# Patient Record
Sex: Female | Born: 1980
Health system: Southern US, Community
[De-identification: ages and names within clinical notes are randomized; demographics above are authoritative.]

## PROBLEM LIST (undated history)

## (undated) DIAGNOSIS — E785 Hyperlipidemia, unspecified: Secondary | ICD-10-CM

## (undated) DIAGNOSIS — I10 Essential (primary) hypertension: Secondary | ICD-10-CM

## (undated) HISTORY — DX: Essential (primary) hypertension: I10

## (undated) HISTORY — DX: Hyperlipidemia, unspecified: E78.5

---

## 2005-05-23 ENCOUNTER — Other Ambulatory Visit: Admission: RE | Admit: 2005-05-23 | Discharge: 2005-05-23 | Payer: Self-pay | Admitting: Family Medicine

## 2006-05-10 ENCOUNTER — Other Ambulatory Visit: Admission: RE | Admit: 2006-05-10 | Discharge: 2006-05-10 | Payer: Self-pay | Admitting: Family Medicine

## 2008-06-26 ENCOUNTER — Other Ambulatory Visit: Admission: RE | Admit: 2008-06-26 | Discharge: 2008-06-26 | Payer: Self-pay | Admitting: Family Medicine

## 2008-08-14 ENCOUNTER — Ambulatory Visit: Payer: Self-pay | Admitting: Oncology

## 2008-09-24 ENCOUNTER — Ambulatory Visit: Payer: Self-pay | Admitting: Oncology

## 2008-09-24 LAB — COMPREHENSIVE METABOLIC PANEL
ALT: 30 U/L (ref 0–35)
AST: 28 U/L (ref 0–37)
Albumin: 3.9 g/dL (ref 3.5–5.2)
Alkaline Phosphatase: 81 U/L (ref 39–117)
Potassium: 3.7 mEq/L (ref 3.5–5.3)
Sodium: 137 mEq/L (ref 135–145)
Total Protein: 7.5 g/dL (ref 6.0–8.3)

## 2008-09-24 LAB — CBC & DIFF AND RETIC
Basophils Absolute: 0 10*3/uL (ref 0.0–0.1)
Eosinophils Absolute: 0.1 10*3/uL (ref 0.0–0.5)
HGB: 13.2 g/dL (ref 11.6–15.9)
LYMPH%: 20.1 % (ref 14.0–49.7)
MONO#: 0.7 10*3/uL (ref 0.1–0.9)
NEUT#: 6.8 10*3/uL — ABNORMAL HIGH (ref 1.5–6.5)
Platelets: 237 10*3/uL (ref 145–400)
RBC: 5.15 10*6/uL (ref 3.70–5.45)
RDW: 14.4 % (ref 11.2–14.5)
Retic %: 1.53 % — ABNORMAL HIGH (ref 0.50–1.50)
Retic Ct Abs: 78.8 10*3/uL — ABNORMAL HIGH (ref 18.30–72.70)
WBC: 9.5 10*3/uL (ref 3.9–10.3)

## 2008-09-28 LAB — HEMOGLOBINOPATHY EVALUATION
Hemoglobin Other: 0 % (ref 0.0–0.0)
Hgb A: 97.7 % (ref 96.8–97.8)
Hgb F Quant: 0 % (ref 0.0–2.0)
Hgb S Quant: 0 % (ref 0.0–0.0)

## 2009-07-30 ENCOUNTER — Other Ambulatory Visit: Admission: RE | Admit: 2009-07-30 | Discharge: 2009-07-30 | Payer: Self-pay | Admitting: Family Medicine

## 2012-06-27 ENCOUNTER — Other Ambulatory Visit: Payer: Self-pay | Admitting: Family Medicine

## 2012-06-27 ENCOUNTER — Other Ambulatory Visit (HOSPITAL_COMMUNITY)
Admission: RE | Admit: 2012-06-27 | Discharge: 2012-06-27 | Disposition: A | Discharge status: Home / Self Care | Payer: BC Managed Care – PPO | Source: Ambulatory Visit | Attending: Family Medicine | Admitting: Family Medicine

## 2012-06-27 DIAGNOSIS — Z1151 Encounter for screening for human papillomavirus (HPV): Secondary | ICD-10-CM | POA: Insufficient documentation

## 2012-06-27 DIAGNOSIS — Z124 Encounter for screening for malignant neoplasm of cervix: Secondary | ICD-10-CM | POA: Insufficient documentation

## 2012-10-26 ENCOUNTER — Other Ambulatory Visit: Payer: Self-pay | Admitting: Cardiology

## 2012-10-26 DIAGNOSIS — E7800 Pure hypercholesterolemia, unspecified: Secondary | ICD-10-CM

## 2012-10-26 DIAGNOSIS — Z79899 Other long term (current) drug therapy: Secondary | ICD-10-CM

## 2012-12-26 ENCOUNTER — Ambulatory Visit: Payer: Self-pay | Admitting: Cardiology

## 2013-02-04 ENCOUNTER — Other Ambulatory Visit (INDEPENDENT_AMBULATORY_CARE_PROVIDER_SITE_OTHER): Payer: BC Managed Care – PPO

## 2013-02-04 DIAGNOSIS — E7800 Pure hypercholesterolemia, unspecified: Secondary | ICD-10-CM

## 2013-02-04 DIAGNOSIS — Z79899 Other long term (current) drug therapy: Secondary | ICD-10-CM

## 2013-02-04 LAB — ALT: ALT: 32 U/L (ref 0–35)

## 2013-02-05 LAB — NMR LIPOPROFILE WITH LIPIDS
HDL Size: 9.2 nm (ref >=9.2)
HDL-C: 45 mg/dL (ref >=40)
LDL Size: 20.2 nm — ABNORMAL LOW (ref >20.5)
Large HDL-P: 4.2 umol/L — ABNORMAL LOW (ref >=4.8)
Small LDL Particle Number: 688 nmol/L — ABNORMAL HIGH (ref <=527)

## 2013-02-10 ENCOUNTER — Other Ambulatory Visit: Payer: Self-pay | Admitting: General Surgery

## 2013-02-10 ENCOUNTER — Encounter: Payer: Self-pay | Admitting: General Surgery

## 2013-02-10 DIAGNOSIS — E785 Hyperlipidemia, unspecified: Secondary | ICD-10-CM

## 2013-03-03 ENCOUNTER — Other Ambulatory Visit: Payer: Self-pay | Admitting: Family Medicine

## 2013-03-03 DIAGNOSIS — R109 Unspecified abdominal pain: Secondary | ICD-10-CM

## 2013-03-05 ENCOUNTER — Ambulatory Visit
Admission: RE | Admit: 2013-03-05 | Discharge: 2013-03-05 | Disposition: A | Discharge status: Home / Self Care | Payer: BC Managed Care – PPO | Source: Ambulatory Visit | Attending: Family Medicine | Admitting: Family Medicine

## 2013-03-05 DIAGNOSIS — R109 Unspecified abdominal pain: Secondary | ICD-10-CM

## 2013-07-07 ENCOUNTER — Encounter: Payer: Self-pay | Admitting: Cardiology

## 2013-08-11 ENCOUNTER — Other Ambulatory Visit (INDEPENDENT_AMBULATORY_CARE_PROVIDER_SITE_OTHER): Payer: BC Managed Care – PPO

## 2013-08-11 DIAGNOSIS — E785 Hyperlipidemia, unspecified: Secondary | ICD-10-CM

## 2013-08-11 LAB — ALT: ALT: 27 U/L (ref 0–35)

## 2013-08-13 LAB — NMR LIPOPROFILE WITH LIPIDS
Cholesterol, Total: 107 mg/dL (ref <200)
HDL Particle Number: 33.5 umol/L (ref >=30.5)
HDL Size: 8.5 nm (ref >=9.2)
HDL-C: 43 mg/dL (ref >=40)
LDL CALC: 50 mg/dL (ref <100)
LDL PARTICLE NUMBER: 936 nmol/L (ref <1000)
LDL Size: 20.4 nm (ref >20.5)
LP-IR SCORE: 58 (ref <=45)
Large HDL-P: 2 umol/L (ref >=4.8)
Large VLDL-P: 1.1 nmol/L (ref <=2.7)
Small LDL Particle Number: 601 nmol/L (ref <=527)
TRIGLYCERIDES: 70 mg/dL (ref <150)
VLDL SIZE: 48.1 nm (ref <=46.6)

## 2013-08-21 ENCOUNTER — Other Ambulatory Visit: Payer: Self-pay | Admitting: Cardiology

## 2013-08-25 ENCOUNTER — Encounter: Payer: Self-pay | Admitting: General Surgery

## 2013-08-25 ENCOUNTER — Other Ambulatory Visit: Payer: Self-pay | Admitting: General Surgery

## 2013-08-25 DIAGNOSIS — E7800 Pure hypercholesterolemia, unspecified: Secondary | ICD-10-CM

## 2014-02-03 ENCOUNTER — Telehealth: Payer: Self-pay | Admitting: *Deleted

## 2014-02-03 ENCOUNTER — Encounter: Payer: Self-pay | Admitting: Cardiology

## 2014-02-03 ENCOUNTER — Ambulatory Visit (INDEPENDENT_AMBULATORY_CARE_PROVIDER_SITE_OTHER): Payer: BC Managed Care – PPO | Admitting: Cardiology

## 2014-02-03 VITALS — BP 134/94 | HR 90 | Ht 67.0 in | Wt 326.0 lb

## 2014-02-03 DIAGNOSIS — K219 Gastro-esophageal reflux disease without esophagitis: Secondary | ICD-10-CM

## 2014-02-03 DIAGNOSIS — R0789 Other chest pain: Secondary | ICD-10-CM

## 2014-02-03 DIAGNOSIS — I10 Essential (primary) hypertension: Secondary | ICD-10-CM

## 2014-02-03 DIAGNOSIS — E785 Hyperlipidemia, unspecified: Secondary | ICD-10-CM

## 2014-02-03 MED ORDER — PANTOPRAZOLE SODIUM 40 MG PO TBEC: 40.0000 mg | DELAYED_RELEASE_TABLET | Freq: Every day | ORAL | Status: DC

## 2014-02-03 NOTE — Progress Notes (Signed)
[address] [place], [place]  [address] Phone: [phone] Fax:  [phone]  Date:  02/03/2014   ID:  Sarah LaundryLaura Ricco, DOB 1980-08-15, MRN [id number]  PCP:  No primary care provider on file.  Cardiologist:  Armanda Magicraci Turner, MD    History of Present Illness: Sarah LaundryLaura Marques is a 33 y.o. female with a history of HTN and dyslipidemia who presents today for followup.  She is doing well.  She denies any  SOB, DOE,palpitaitons,  dizziness or syncope.  She has been having chest pressure at any time of day or night and she had this when I saw her 2 years ago and stress test was fine.  She describes it as a tightness.  She has not taken anything for GERD but occasionally takes TUMS which sometimes helps.  She has not been having any belching but her symptoms are worse in the evening.  It is nonexertional.  She has been upset and is teary eyed today after her grandmother died recently.  She occasionally has some LE edema.     Wt Readings from Last 3 Encounters:  02/03/14 326 lb (147.873 kg)     Past Medical History  Diagnosis Date  . Hypertension   . Hyperlipidemia     Current Outpatient Prescriptions  Medication Sig Dispense Refill  . aspirin 81 MG tablet Take 81 mg by mouth daily.    . fexofenadine (ALLEGRA) 30 MG tablet Take 30 mg by mouth daily.    Marland Kitchen. lisinopril (PRINIVIL,ZESTRIL) 40 MG tablet Take 40 mg by mouth daily.  1  . simvastatin (ZOCOR) 40 MG tablet Take 40 mg by mouth daily.  1   No current facility-administered medications for this visit.    Allergies:    Allergies  Allergen Reactions  . Minocycline     PSEUDOTUMOR. WAS TAKING FOR ACNE     Social History:  The patient  reports that she has never smoked. She does not have any smokeless tobacco history on file. She reports that she drinks alcohol. She reports that she does not use illicit drugs.   Family History:  The patient's family history includes Cancer - Other in her maternal grandmother; Diabetes type II in  her mother; Heart attack in her maternal grandfather; Hyperlipidemia in her father, paternal grandfather, and paternal grandmother; Hypertension in her father, maternal grandfather, mother, paternal grandfather, and sister; Multiple sclerosis in her paternal grandmother; Pneumonia in her maternal grandfather and paternal grandmother.   ROS:  Please see the history of present illness.      All other systems reviewed and negative.   PHYSICAL EXAM: VS:  BP 134/94 mmHg  Pulse 90  Ht 5\' 7"  (1.702 m)  Wt 326 lb (147.873 kg)  BMI 51.05 kg/m2 Well nourished, well developed, in no acute distress HEENT: normal Neck: no JVD Cardiac:  normal S1, S2; RRR; no murmur Lungs:  clear to auscultation bilaterally, no wheezing, rhonchi or rales Abd: soft, nontender, no hepatomegaly Ext: no edema Skin: warm and dry Neuro:  CNs 2-12 intact, no focal abnormalities noted  EKG:  NSR with low voltage and no ST changes     ASSESSMENT AND PLAN:  1. HTN with elevated BP today but at home it has been running 120-135/80-2395mmHg but over the past 3 weeks it has been running low at 96/4876mmHg at her doctors office and then at home 97/1371mmHg.  I have asked her to continue on her Lisinopril and check her BP daily for a week and  call with results.   2. Dyslipidemia - I will get her last lipids from her PCP.  She will continue Simvastatin 3. Morbid obesity 4. Atypical CP that sounds more like GERD and had a stress test that was normal for similar symptoms.  I will try Protonix 40mg  daily.  Followup with PA in 2 weeks.  If symptoms dont improve then we will repeat an ETT.   Followup with me in 6 months  Signed, Armanda Magicraci Turner, MD [facility] 02/03/2014 3:22 PM

## 2014-02-03 NOTE — Telephone Encounter (Signed)
requested records, add on and chart prep was not advised of this until pt arrived.Marland Kitchen..Marland Kitchen

## 2014-02-03 NOTE — Patient Instructions (Addendum)
Your physician has recommended you make the following change in your medication:  1) START Protonix 40 mg daily  Your physician recommends that you schedule a follow-up appointment in: 2 weeks with an APP  Please check your blood pressure daily for one week and call us with results.  Your physician wants you to follow-up in: 6 months with Dr. Mayford Knifeurner. You will receive a reminder letter in the mail two months in advance. If you don't receive a letter, please call our office to schedule the follow-up appointment.

## 2014-02-04 NOTE — Telephone Encounter (Signed)
Found records in folder on turners cart, did receive records upon request and will save for next appt..Marland Kitchen

## 2014-02-17 ENCOUNTER — Telehealth: Payer: Self-pay | Admitting: Cardiology

## 2014-02-17 NOTE — Telephone Encounter (Signed)
New Msg        Susie from [facility] calling states they do not have any recent lipids on the pt, the last was 2008.   May be reached at [phone].

## 2014-02-24 ENCOUNTER — Ambulatory Visit (INDEPENDENT_AMBULATORY_CARE_PROVIDER_SITE_OTHER): Payer: BLUE CROSS/BLUE SHIELD | Admitting: Physician Assistant

## 2014-02-24 ENCOUNTER — Encounter: Payer: Self-pay | Admitting: Physician Assistant

## 2014-02-24 ENCOUNTER — Other Ambulatory Visit (INDEPENDENT_AMBULATORY_CARE_PROVIDER_SITE_OTHER): Payer: BLUE CROSS/BLUE SHIELD | Admitting: *Deleted

## 2014-02-24 VITALS — BP 132/86 | HR 108 | Ht 67.0 in | Wt 324.0 lb

## 2014-02-24 DIAGNOSIS — E7800 Pure hypercholesterolemia, unspecified: Secondary | ICD-10-CM

## 2014-02-24 DIAGNOSIS — R0789 Other chest pain: Secondary | ICD-10-CM

## 2014-02-24 DIAGNOSIS — I10 Essential (primary) hypertension: Secondary | ICD-10-CM

## 2014-02-24 DIAGNOSIS — E785 Hyperlipidemia, unspecified: Secondary | ICD-10-CM

## 2014-02-24 LAB — HEPATIC FUNCTION PANEL
ALK PHOS: 132 U/L — AB (ref 39–117)
ALT: 65 U/L — ABNORMAL HIGH (ref 0–35)
AST: 37 U/L (ref 0–37)
Albumin: 3.9 g/dL (ref 3.5–5.2)
Bilirubin, Direct: 0.2 mg/dL (ref 0.0–0.3)
Total Bilirubin: 0.5 mg/dL (ref 0.2–1.2)
Total Protein: 7.4 g/dL (ref 6.0–8.3)

## 2014-02-24 NOTE — Addendum Note (Signed)
Addended by: Tonita PhoenixBOWDEN, ROBIN K on: 02/24/2014 08:18 AM   Modules accepted: Orders

## 2014-02-24 NOTE — Assessment & Plan Note (Signed)
Patient had lipids last June. She will fax these to us. Repeat lipid panel in June 2016.

## 2014-02-24 NOTE — Assessment & Plan Note (Signed)
Exercise and weight loss program such as Weight Watchers recommended to the patient.

## 2014-02-24 NOTE — Progress Notes (Signed)
HPI: This is a 34 year old female patient of Dr. Carolanne Grumbling who she saw on 02/03/14 with atypical chest pain that sound more like GERD. She has stress test in 2013 for similar symptoms that was normal. She prescribed Protonix 40 mg once daily and had her follow-up with me today. She also has hypertension and her blood pressure was elevated at that office visit but she was very teary-eyed because her grandmother just died. Her blood pressures typically run on the low side so no changes were made. She also has HLD and morbid obesity. She has not had a lipid panel drawn in many years. 2-D echo in 2008 was normal.  Patient comes in today feeling much better. She's had no further chest pain since starting the Protonix. She said she had a lipid panel last June. She will fax it to [place]. She is starting an exercise program at her husband walking [place] their [place] every day and talking about buying a treadmill. Her blood pressures are running about 130/85 at home.  Allergies  Allergen Reactions  . Minocycline     PSEUDOTUMOR. WAS TAKING FOR ACNE      Current Outpatient Prescriptions  Medication Sig Dispense Refill  . aspirin 81 MG tablet Take 81 mg by mouth daily.    . fexofenadine (ALLEGRA) 30 MG tablet Take 30 mg by mouth daily.    Marland Kitchen lisinopril (PRINIVIL,ZESTRIL) 40 MG tablet Take 40 mg by mouth daily.  1  . pantoprazole (PROTONIX) 40 MG tablet Take 1 tablet (40 mg total) by mouth daily. 30 tablet 3  . simvastatin (ZOCOR) 40 MG tablet Take 40 mg by mouth daily.  1   No current facility-administered medications for this visit.    Past Medical History  Diagnosis Date  . Hypertension   . Hyperlipidemia     No past surgical history on file.  Family History  Problem Relation Age of Onset  . Hypertension Mother   . Diabetes type II Mother   . Hypertension Father   . Hyperlipidemia Father   . Hypertension Sister   . Cancer - Other Maternal Grandmother   . Hypertension Maternal  Grandfather   . Heart attack Maternal Grandfather   . Pneumonia Maternal Grandfather   . Hyperlipidemia Paternal Grandmother   . Multiple sclerosis Paternal Grandmother   . Pneumonia Paternal Grandmother   . Hypertension Paternal Grandfather   . Hyperlipidemia Paternal Grandfather     History   Social History  . Marital Status: Married    Spouse Name: N/A    Number of Children: N/A  . Years of Education: N/A   Occupational History  . Not on file.   Social History Main Topics  . Smoking status: Never Smoker   . Smokeless tobacco: Not on file  . Alcohol Use: Yes     Comment: OCCASIONALLY   . Drug Use: No  . Sexual Activity: Not on file   Other Topics Concern  . Not on file   Social History Narrative  . No narrative on file    ROS: See history of present illness otherwise negative  BP 132/86 mmHg  Pulse 108  Ht  (1.702 m)  Wt 324 lb (146.965 kg)  BMI 50.73 kg/m2  PHYSICAL EXAM: Well-nournished, in no acute distress. Neck: No JVD, HJR, Bruit, or thyroid enlargement  Lungs: No tachypnea, clear without wheezing, rales, or rhonchi  Cardiovascular: RRR, PMI not displaced, Normal S1 and S2, no murmurs, gallops, bruit, thrill,  or heave.  Abdomen: BS normal. Soft without organomegaly, masses, lesions or tenderness.  Extremities: without cyanosis, clubbing or edema. Good distal pulses bilateral  SKin: Warm, no lesions or rashes   Musculoskeletal: No deformities  Neuro: no focal signs   Wt Readings from Last 3 Encounters:  02/03/14 326 lb (147.873 kg)    Lab Results  Component Value Date   WBC 9.5 09/24/2008   HGB 13.2 09/24/2008   HCT 39.1 09/24/2008   PLT 237 09/24/2008   GLUCOSE 98 09/24/2008   TRIG 70 08/21/2013   LDLCALC 50 08/21/2013   ALT 27 08/11/2013   AST 28 09/24/2008   NA 137 09/24/2008   K 3.7 09/24/2008   CL 106 09/24/2008   CREATININE 0.85 09/24/2008   BUN 15 09/24/2008   CO2 27 09/24/2008    EKG: Not performed

## 2014-02-24 NOTE — Assessment & Plan Note (Signed)
Chest pain has resolved with addition of Protonix. Normal stress test in 2013. Follow-up with Dr. Mayford Knifeurner in 6 months.

## 2014-02-24 NOTE — Patient Instructions (Signed)
Your physician recommends that you continue on your current medications as directed. Please refer to the Current Medication list given to you today.  Your physician encouraged you to lose weight for better health.  Your physician wants you to follow-up in: 6 months with Dr.Turner You will receive a reminder letter in the mail two months in advance. If you don't receive a letter, please call our office to schedule the follow-up appointment.     Low-Sodium Eating Plan Sodium raises blood pressure and causes water to be held in the body. Getting less sodium from food will help lower your blood pressure, reduce any swelling, and protect your heart, liver, and kidneys. We get sodium by adding salt (sodium chloride) to food. Most of our sodium comes from canned, boxed, and frozen foods. Restaurant foods, fast foods, and pizza are also very high in sodium. Even if you take medicine to lower your blood pressure or to reduce fluid in your body, getting less sodium from your food is important. WHAT IS MY PLAN? Most people should limit their sodium intake to 2,300 mg a day. Your health care provider recommends that you limit your sodium intake to __________ a day.  WHAT DO I NEED TO KNOW ABOUT THIS EATING PLAN? For the low-sodium eating plan, you will follow these general guidelines:  Choose foods with a % Daily Value for sodium of less than 5% (as listed on the food label).   Use salt-free seasonings or herbs instead of table salt or sea salt.   Check with your health care provider or pharmacist before using salt substitutes.   Eat fresh foods.  Eat more vegetables and fruits.  Limit canned vegetables. If you do use them, rinse them well to decrease the sodium.   Limit cheese to 1 oz (28 g) per day.   Eat lower-sodium products, often labeled as "lower sodium" or "no salt added."  Avoid foods that contain monosodium glutamate (MSG). MSG is sometimes added to [place] food and some canned  foods.  Check food labels (Nutrition Facts labels) on foods to learn how much sodium is in one serving.  Eat more home-cooked food and less restaurant, buffet, and fast food.  When eating at a restaurant, ask that your food be prepared with less salt or none, if possible.  HOW DO I READ FOOD LABELS FOR SODIUM INFORMATION? The Nutrition Facts label lists the amount of sodium in one serving of the food. If you eat more than one serving, you must multiply the listed amount of sodium by the number of servings. Food labels may also identify foods as:  Sodium free--Less than 5 mg in a serving.  Very low sodium--35 mg or less in a serving.  Low sodium--140 mg or less in a serving.  Light in sodium--50% less sodium in a serving. For example, if a food that usually has 300 mg of sodium is changed to become light in sodium, it will have 150 mg of sodium.  Reduced sodium--25% less sodium in a serving. For example, if a food that usually has 400 mg of sodium is changed to reduced sodium, it will have 300 mg of sodium. WHAT FOODS CAN I EAT? Grains Low-sodium cereals, including oats, puffed wheat and rice, and shredded wheat cereals. Low-sodium crackers. Unsalted rice and pasta. Lower-sodium bread.  Vegetables Frozen or fresh vegetables. Low-sodium or reduced-sodium canned vegetables. Low-sodium or reduced-sodium tomato sauce and paste. Low-sodium or reduced-sodium tomato and vegetable juices.  Fruits Fresh, frozen, and canned fruit.  Fruit juice.  Meat and Other Protein Products Low-sodium canned tuna and salmon. Fresh or frozen meat, poultry, seafood, and fish. Lamb. Unsalted nuts. Dried beans, peas, and lentils without added salt. Unsalted canned beans. Homemade soups without salt. Eggs.  Dairy Milk. Soy milk. Ricotta cheese. Low-sodium or reduced-sodium cheeses. Yogurt.  Condiments Fresh and dried herbs and spices. Salt-free seasonings. Onion and garlic powders. Low-sodium varieties of  mustard and ketchup. Lemon juice.  Fats and Oils Reduced-sodium salad dressings. Unsalted butter.  Other Unsalted popcorn and pretzels.  The items listed above may not be a complete list of recommended foods or beverages. Contact your dietitian for more options. WHAT FOODS ARE NOT RECOMMENDED? Grains Instant hot cereals. Bread stuffing, pancake, and biscuit mixes. Croutons. Seasoned rice or pasta mixes. Noodle soup cups. Boxed or frozen macaroni and cheese. Self-rising flour. Regular salted crackers. Vegetables Regular canned vegetables. Regular canned tomato sauce and paste. Regular tomato and vegetable juices. Frozen vegetables in sauces. Salted french fries. Olives. Rosita Fire. Relishes. Sauerkraut. Salsa. Meat and Other Protein Products Salted, canned, smoked, spiced, or pickled meats, seafood, or fish. Bacon, ham, sausage, hot dogs, corned beef, chipped beef, and packaged luncheon meats. Salt pork. Jerky. Pickled herring. Anchovies, regular canned tuna, and sardines. Salted nuts. Dairy Processed cheese and cheese spreads. Cheese curds. Blue cheese and cottage cheese. Buttermilk.  Condiments Onion and garlic salt, seasoned salt, table salt, and sea salt. Canned and packaged gravies. Worcestershire sauce. Tartar sauce. Barbecue sauce. Teriyaki sauce. Soy sauce, including reduced sodium. Steak sauce. Fish sauce. Oyster sauce. Cocktail sauce. Horseradish. Regular ketchup and mustard. Meat flavorings and tenderizers. Bouillon cubes. Hot sauce. Tabasco sauce. Marinades. Taco seasonings. Relishes. Fats and Oils Regular salad dressings. Salted butter. Margarine. Ghee. Bacon fat.  Other Potato and tortilla chips. Corn chips and puffs. Salted popcorn and pretzels. Canned or dried soups. Pizza. Frozen entrees and pot pies.  The items listed above may not be a complete list of foods and beverages to avoid. Contact your dietitian for more information. Document Released: 07/15/2001 Document  Revised: 01/28/2013 Document Reviewed: 11/27/2012 [facility] Patient Information 2015 [place], [place]. This information is not intended to replace advice given to you by your health care provider. Make sure you discuss any questions you have with your health care provider.

## 2014-02-24 NOTE — Assessment & Plan Note (Signed)
Blood pressure is stable. Recommend 2 g sodium diet.

## 2014-02-25 ENCOUNTER — Other Ambulatory Visit: Payer: Self-pay

## 2014-02-26 LAB — NMR LIPOPROFILE WITH LIPIDS
Cholesterol, Total: 126 mg/dL (ref 100–199)
HDL Particle Number: 32.3 umol/L (ref >=30.5)
HDL Size: 9.1 nm — ABNORMAL LOW (ref >=9.2)
HDL-C: 46 mg/dL (ref >39)
LARGE HDL: 4.2 umol/L — AB (ref >=4.8)
LDL CALC: 64 mg/dL (ref 0–99)
LDL Particle Number: 824 nmol/L (ref <1000)
LDL SIZE: 20.6 nm (ref >=20.8)
LP-IR Score: 53 — ABNORMAL HIGH (ref <=45)
Large VLDL-P: 3.2 nmol/L — ABNORMAL HIGH (ref <=2.7)
SMALL LDL PARTICLE NUMBER: 494 nmol/L (ref <=527)
TRIGLYCERIDES: 81 mg/dL (ref 0–149)
VLDL Size: 47.2 nm — ABNORMAL HIGH (ref <=46.6)

## 2014-05-28 ENCOUNTER — Encounter: Payer: Self-pay | Admitting: Cardiology

## 2014-07-03 ENCOUNTER — Other Ambulatory Visit: Payer: Self-pay | Admitting: Cardiology

## 2014-09-02 ENCOUNTER — Other Ambulatory Visit: Payer: Self-pay | Admitting: Cardiology

## 2014-09-02 ENCOUNTER — Other Ambulatory Visit: Payer: Self-pay

## 2014-09-02 MED ORDER — PANTOPRAZOLE SODIUM 40 MG PO TBEC: 40.0000 mg | DELAYED_RELEASE_TABLET | Freq: Every day | ORAL | Status: DC

## 2014-09-29 NOTE — Progress Notes (Signed)
Cardiology Office Note   Date:  09/30/2014   ID:  Sarah Frey, DOB [birth year], MRN [id number]  PCP:  Emeterio Reeve, MD    Chief Complaint  Patient presents with  . Atypical chest pain      History of Present Illness: This is a 34 year old female  who has a history of atypical chest pain that sounded more like GERD. She had a stress test in 2013 for similar symptoms that was normal. She was started onProtonix 40 mg once daily and was seen back by the PA and was feeling much better. She's had no further chest pain since starting the Protonix but still occasionally has some heartburn that resolves with TUMS. She has had some LE edema usually at work when sitting for prolonged periods of time.  She has been walking some for exercise. She denies any SOB, dizziness, palptiations or syncope.      Past Medical History  Diagnosis Date  . Hypertension   . Hyperlipidemia     History reviewed. No pertinent past surgical history.   Current Outpatient Prescriptions  Medication Sig Dispense Refill  . aspirin 81 MG tablet Take 81 mg by mouth daily.    . fexofenadine (ALLEGRA) 30 MG tablet Take 30 mg by mouth daily.    Marland Kitchen lisinopril (PRINIVIL,ZESTRIL) 40 MG tablet Take 40 mg by mouth daily.    . pantoprazole (PROTONIX) 40 MG tablet Take 1 tablet (40 mg total) by mouth daily. 30 tablet 1  . simvastatin (ZOCOR) 40 MG tablet Take 40 mg by mouth daily.     No current facility-administered medications for this visit.    Allergies:   Minocycline    Social History:  The patient  reports that she has never smoked. She does not have any smokeless tobacco history on file. She reports that she drinks alcohol. She reports that she does not use illicit drugs.   Family History:  The patient's family history includes Cancer - Other in her maternal grandmother; Diabetes type II in her mother; Heart attack in her maternal grandfather; Hyperlipidemia in her father, paternal  grandfather, and paternal grandmother; Hypertension in her father, maternal grandfather, mother, paternal grandfather, and sister; Multiple sclerosis in her paternal grandmother; Pneumonia in her maternal grandfather and paternal grandmother.    ROS:  Please see the history of present illness.   Otherwise, review of systems are positive for none.   All other systems are reviewed and negative.    PHYSICAL EXAM: VS:  BP 120/82 mmHg  Pulse 87  Ht  (1.702 m)  Wt 321 lb 12.8 oz (145.968 kg)  BMI 50.39 kg/m2  SpO2 98% , BMI Body mass index is 50.39 kg/(m^2). GEN: Well nourished, well developed, in no acute distress HEENT: normal Neck: no JVD, carotid bruits, or masses Cardiac: RRR; no murmurs, rubs, or gallops,no edema  Respiratory:  clear to auscultation bilaterally, normal work of breathing GI: soft, nontender, nondistended, + BS MS: no deformity or atrophy Skin: warm and dry, no rash Neuro:  Strength and sensation are intact Psych: euthymic mood, full affect   EKG:  EKG is not ordered today.    Recent Labs: 02/24/2014: ALT 65*    Lipid Panel    Component Value Date/Time   CHOL 126 02/24/2014 0818   TRIG 81 02/24/2014 0818   HDL 46 02/24/2014 0818   LDLCALC 64 02/24/2014 0818  Wt Readings from Last 3 Encounters:  09/30/14 321 lb 12.8 oz (145.968 kg)  02/24/14 324 lb (146.965 kg)  02/03/14 326 lb (147.873 kg)    ASSESSMENT AND PLAN:  1. HTN controlled BP on ACE I. 2. Dyslipidemia - She will continue Simvastatin.  Check FLP and ALT 3. Morbid obesity 4.      Atypical CP that sounds more like GERD and had a stress test that was normal for similar symptoms.This has resolved on PPI     Current medicines are reviewed at length with the patient today.  The patient does not have concerns regarding medicines.  The following changes have been made:  no change  Labs/ tests ordered today: See above Assessment and Plan No orders of the defined types were  placed in this encounter.     Disposition:   FU with me in PRN Signed, Quintella Reichert, MD  09/30/2014 9:09 AM    [facility] [address] [place], [place], [place]  [address] Phone: [phone]; Fax: [phone]

## 2014-09-30 ENCOUNTER — Ambulatory Visit (INDEPENDENT_AMBULATORY_CARE_PROVIDER_SITE_OTHER): Payer: 59 | Admitting: Cardiology

## 2014-09-30 ENCOUNTER — Encounter: Payer: Self-pay | Admitting: Cardiology

## 2014-09-30 VITALS — BP 120/82 | HR 87 | Ht 67.0 in | Wt 321.8 lb

## 2014-09-30 DIAGNOSIS — I10 Essential (primary) hypertension: Secondary | ICD-10-CM

## 2014-09-30 DIAGNOSIS — E785 Hyperlipidemia, unspecified: Secondary | ICD-10-CM | POA: Diagnosis not present

## 2014-09-30 DIAGNOSIS — R0789 Other chest pain: Secondary | ICD-10-CM

## 2014-09-30 LAB — HEPATIC FUNCTION PANEL
ALT: 27 U/L (ref 0–35)
AST: 20 U/L (ref 0–37)
Albumin: 3.9 g/dL (ref 3.5–5.2)
Alkaline Phosphatase: 101 U/L (ref 39–117)
BILIRUBIN DIRECT: 0.1 mg/dL (ref 0.0–0.3)
Total Bilirubin: 0.4 mg/dL (ref 0.2–1.2)
Total Protein: 7.2 g/dL (ref 6.0–8.3)

## 2014-09-30 LAB — LIPID PANEL
Cholesterol: 109 mg/dL (ref 0–200)
HDL: 38.5 mg/dL — ABNORMAL LOW (ref >39.00)
LDL Cholesterol: 54 mg/dL (ref 0–99)
NonHDL: 70.85
Total CHOL/HDL Ratio: 3
Triglycerides: 86 mg/dL (ref 0.0–149.0)
VLDL: 17.2 mg/dL (ref 0.0–40.0)

## 2014-09-30 NOTE — Patient Instructions (Signed)
Medication Instructions:  Your physician recommends that you continue on your current medications as directed. Please refer to the Current Medication list given to you today.   Labwork: Today: LFTs, Lipids  Testing/Procedures: None  Follow-Up: Your physician wants you to follow-up in: 1 year with Dr. Mayford Knife. You will receive a reminder letter in the mail two months in advance. If you don't receive a letter, please call our office to schedule the follow-up appointment.   Any Other Special Instructions Will Be Listed Below (If Applicable).

## 2014-10-01 ENCOUNTER — Encounter: Payer: Self-pay | Admitting: Cardiology

## 2014-10-01 NOTE — Telephone Encounter (Signed)
New message ° ° °Patient returning call back to nurse.  °

## 2014-10-01 NOTE — Telephone Encounter (Signed)
This encounter was created in error - please disregard.

## 2014-11-09 ENCOUNTER — Other Ambulatory Visit: Payer: Self-pay | Admitting: Cardiology

## 2015-06-30 DIAGNOSIS — M25522 Pain in left elbow: Secondary | ICD-10-CM | POA: Diagnosis not present

## 2015-07-26 DIAGNOSIS — Z79899 Other long term (current) drug therapy: Secondary | ICD-10-CM | POA: Diagnosis not present

## 2015-07-26 DIAGNOSIS — I10 Essential (primary) hypertension: Secondary | ICD-10-CM | POA: Diagnosis not present

## 2015-07-26 DIAGNOSIS — D509 Iron deficiency anemia, unspecified: Secondary | ICD-10-CM | POA: Diagnosis not present

## 2015-07-26 DIAGNOSIS — E559 Vitamin D deficiency, unspecified: Secondary | ICD-10-CM | POA: Diagnosis not present

## 2015-07-26 DIAGNOSIS — E8881 Metabolic syndrome: Secondary | ICD-10-CM | POA: Diagnosis not present

## 2015-07-28 ENCOUNTER — Other Ambulatory Visit (HOSPITAL_COMMUNITY)
Admission: RE | Admit: 2015-07-28 | Discharge: 2015-07-28 | Disposition: A | Discharge status: Home / Self Care | Payer: BLUE CROSS/BLUE SHIELD | Source: Ambulatory Visit | Attending: Family Medicine | Admitting: Family Medicine

## 2015-07-28 ENCOUNTER — Other Ambulatory Visit: Payer: Self-pay | Admitting: Family Medicine

## 2015-07-28 DIAGNOSIS — Z0000 Encounter for general adult medical examination without abnormal findings: Secondary | ICD-10-CM | POA: Diagnosis not present

## 2015-07-28 DIAGNOSIS — Z01411 Encounter for gynecological examination (general) (routine) with abnormal findings: Secondary | ICD-10-CM | POA: Diagnosis not present

## 2015-07-28 DIAGNOSIS — Z23 Encounter for immunization: Secondary | ICD-10-CM | POA: Diagnosis not present

## 2015-07-29 LAB — CYTOLOGY - PAP

## 2015-08-26 ENCOUNTER — Encounter: Payer: Self-pay | Admitting: Cardiology

## 2015-10-08 ENCOUNTER — Ambulatory Visit: Payer: Self-pay | Admitting: Cardiology

## 2015-10-12 ENCOUNTER — Encounter: Payer: Self-pay | Admitting: Cardiology

## 2015-10-27 ENCOUNTER — Encounter: Payer: Self-pay | Admitting: Cardiology

## 2015-10-27 ENCOUNTER — Ambulatory Visit (INDEPENDENT_AMBULATORY_CARE_PROVIDER_SITE_OTHER): Payer: BLUE CROSS/BLUE SHIELD | Admitting: Cardiology

## 2015-10-27 ENCOUNTER — Encounter (INDEPENDENT_AMBULATORY_CARE_PROVIDER_SITE_OTHER): Payer: Self-pay

## 2015-10-27 VITALS — BP 132/70 | HR 96 | Ht 67.0 in | Wt 319.0 lb

## 2015-10-27 DIAGNOSIS — K219 Gastro-esophageal reflux disease without esophagitis: Secondary | ICD-10-CM | POA: Diagnosis not present

## 2015-10-27 DIAGNOSIS — I10 Essential (primary) hypertension: Secondary | ICD-10-CM | POA: Diagnosis not present

## 2015-10-27 DIAGNOSIS — R0789 Other chest pain: Secondary | ICD-10-CM | POA: Diagnosis not present

## 2015-10-27 NOTE — Progress Notes (Signed)
Cardiology Office Note    Date:  10/27/2015   ID:  Sarah Frey, DOB 05/31/1980, MRN [id number]  PCP:  Emeterio ReeveWOLTERS,SHARON A, MD  Cardiologist:  Armanda Magicraci Jakala Herford, MD   Chief Complaint  Patient presents with  . Chest Pain  . Hypertension    History of Present Illness:  Sarah Frey is a 35 y.o. female who has a  history of atypical chest pain felt related to GERD. She had a stress test in 2013 for similar symptoms that was normal and her symptoms resolved with PPI therapy and TUMS. She presents back today for followup.  She has not had any further episodes of CP.  She has had some LE edema usually at work when sitting for prolonged periods of time.  She denies any SOB, dizziness, palptiations or syncope.  She does not get any significant aerobic exercise.    Past Medical History:  Diagnosis Date  . Hyperlipidemia   . Hypertension     History reviewed. No pertinent surgical history.  Current Medications: Outpatient Medications Prior to Visit  Medication Sig Dispense Refill  . aspirin 81 MG tablet Take 81 mg by mouth daily.    . fexofenadine (ALLEGRA) 30 MG tablet Take 30 mg by mouth daily.    Marland Kitchen. lisinopril (PRINIVIL,ZESTRIL) 40 MG tablet Take 40 mg by mouth daily.    . pantoprazole (PROTONIX) 40 MG tablet TAKE ONE TABLET BY MOUTH ONCE DAILY 30 tablet 6  . simvastatin (ZOCOR) 40 MG tablet Take 40 mg by mouth daily.     No facility-administered medications prior to visit.      Allergies:   Minocycline   Social History   Social History  . Marital status: Married    Spouse name: N/A  . Number of children: N/A  . Years of education: N/A   Social History Main Topics  . Smoking status: Never Smoker  . Smokeless tobacco: Never Used  . Alcohol use Yes     Comment: OCCASIONALLY   . Drug use: No  . Sexual activity: Not Asked   Other Topics Concern  . None   Social History Narrative  . None     Family History:  The patient's family history includes Cancer - Other in her  maternal grandmother; Diabetes type II in her mother; Heart attack in her maternal grandfather; Hyperlipidemia in her father, paternal grandfather, and paternal grandmother; Hypertension in her father, maternal grandfather, mother, paternal grandfather, and sister; Multiple sclerosis in her paternal grandmother; Pneumonia in her maternal grandfather and paternal grandmother.   ROS:   Please see the history of present illness.    ROS All other systems reviewed and are negative.  No flowsheet data found.     PHYSICAL EXAM:   VS:  BP 132/70   Pulse 96   Ht 5\' 7"  (1.702 m)   Wt (!) 319 lb (144.7 kg)   BMI 49.96 kg/m    GEN: Well nourished, well developed, in no acute distress  HEENT: normal  Neck: no JVD, carotid bruits, or masses Cardiac: RRR; no murmurs, rubs, or gallops,no edema.  Intact distal pulses bilaterally.  Respiratory:  clear to auscultation bilaterally, normal work of breathing GI: soft, nontender, nondistended, + BS MS: no deformity or atrophy  Skin: warm and dry, no rash Neuro:  Alert and Oriented x 3, Strength and sensation are intact Psych: euthymic mood, full affect  Wt Readings from Last 3 Encounters:  10/27/15 (!) 319 lb (144.7 kg)  09/30/14 (!) 321 lb 12.8  oz (146 kg)  02/24/14 (!) 324 lb (147 kg)      Studies/Labs Reviewed:   EKG:  EKG is ordered today.  The ekg ordered today demonstrates NSR at 96bpm with no ST changes  Recent Labs: No results found for requested labs within last 8760 hours.   Lipid Panel    Component Value Date/Time   CHOL 109 09/30/2014 0932   CHOL 126 02/24/2014 0818   TRIG 86.0 09/30/2014 0932   TRIG 81 02/24/2014 0818   HDL 38.50 (L) 09/30/2014 0932   HDL 46 02/24/2014 0818   CHOLHDL 3 09/30/2014 0932   VLDL 17.2 09/30/2014 0932   LDLCALC 54 09/30/2014 0932   LDLCALC 64 02/24/2014 0818    Additional studies/ records that were reviewed today include:  none    ASSESSMENT:    1. Atypical chest pain   2. Benign  essential HTN   3. Gastroesophageal reflux disease without esophagitis      PLAN:  In order of problems listed above:  1. Atypical chest pain c/w GERD with no reoccurrence of CP. 2. HTN - BP controlled on current meds.  Continue ACE I.  3. GERD - continue PPI.    Medication Adjustments/Labs and Tests Ordered: Current medicines are reviewed at length with the patient today.  Concerns regarding medicines are outlined above.  Medication changes, Labs and Tests ordered today are listed in the Patient Instructions below.  Patient Instructions  Your physician recommends that you continue on your current medications as directed. Please refer to the Current Medication list given to you today.  Your physician wants you to follow-up in: YEAR WITH  DR  Sherlyn Lick will receive a reminder letter in the mail two months in advance. If you don't receive a letter, please call our office to schedule the follow-up appointment.     Signed, Armanda Magic, MD  10/27/2015 11:46 AM    [facility] [address] [place], [place], [place]  [address] Phone: [phone]; Fax: [phone]

## 2015-10-27 NOTE — Patient Instructions (Signed)
Your physician recommends that you continue on your current medications as directed. Please refer to the Current Medication list given to you today.   Your physician wants you to follow-up in: YEAR WITH DR TURNER You will receive a reminder letter in the mail two months in advance. If you don't receive a letter, please call our office to schedule the follow-up appointment.  

## 2015-11-29 DIAGNOSIS — E559 Vitamin D deficiency, unspecified: Secondary | ICD-10-CM | POA: Diagnosis not present

## 2016-01-25 DIAGNOSIS — J069 Acute upper respiratory infection, unspecified: Secondary | ICD-10-CM | POA: Diagnosis not present

## 2016-04-19 DIAGNOSIS — E119 Type 2 diabetes mellitus without complications: Secondary | ICD-10-CM | POA: Diagnosis not present

## 2016-09-25 DIAGNOSIS — E7800 Pure hypercholesterolemia, unspecified: Secondary | ICD-10-CM | POA: Diagnosis not present

## 2016-09-25 DIAGNOSIS — E559 Vitamin D deficiency, unspecified: Secondary | ICD-10-CM | POA: Diagnosis not present

## 2016-09-25 DIAGNOSIS — Z79899 Other long term (current) drug therapy: Secondary | ICD-10-CM | POA: Diagnosis not present

## 2016-09-25 DIAGNOSIS — E8881 Metabolic syndrome: Secondary | ICD-10-CM | POA: Diagnosis not present

## 2016-09-25 DIAGNOSIS — D509 Iron deficiency anemia, unspecified: Secondary | ICD-10-CM | POA: Diagnosis not present

## 2016-09-25 DIAGNOSIS — Z0000 Encounter for general adult medical examination without abnormal findings: Secondary | ICD-10-CM | POA: Diagnosis not present

## 2016-11-08 ENCOUNTER — Other Ambulatory Visit: Payer: Self-pay | Admitting: Cardiology

## 2016-11-08 DIAGNOSIS — F4322 Adjustment disorder with anxiety: Secondary | ICD-10-CM | POA: Diagnosis not present

## 2016-11-09 NOTE — Telephone Encounter (Signed)
An appt is need for future refills, please call the office. 1st attempt

## 2016-11-15 DIAGNOSIS — F4322 Adjustment disorder with anxiety: Secondary | ICD-10-CM | POA: Diagnosis not present

## 2016-11-23 DIAGNOSIS — F4322 Adjustment disorder with anxiety: Secondary | ICD-10-CM | POA: Diagnosis not present

## 2016-11-30 DIAGNOSIS — F4322 Adjustment disorder with anxiety: Secondary | ICD-10-CM | POA: Diagnosis not present

## 2016-12-08 DIAGNOSIS — Z6841 Body Mass Index (BMI) 40.0 and over, adult: Secondary | ICD-10-CM | POA: Diagnosis not present

## 2016-12-08 DIAGNOSIS — S20219A Contusion of unspecified front wall of thorax, initial encounter: Secondary | ICD-10-CM | POA: Diagnosis not present

## 2016-12-08 DIAGNOSIS — M25512 Pain in left shoulder: Secondary | ICD-10-CM | POA: Diagnosis not present

## 2016-12-08 DIAGNOSIS — M898X1 Other specified disorders of bone, shoulder: Secondary | ICD-10-CM | POA: Diagnosis not present

## 2016-12-15 DIAGNOSIS — F4322 Adjustment disorder with anxiety: Secondary | ICD-10-CM | POA: Diagnosis not present

## 2016-12-20 DIAGNOSIS — F4322 Adjustment disorder with anxiety: Secondary | ICD-10-CM | POA: Diagnosis not present

## 2016-12-26 DIAGNOSIS — F4322 Adjustment disorder with anxiety: Secondary | ICD-10-CM | POA: Diagnosis not present

## 2017-01-03 DIAGNOSIS — F4322 Adjustment disorder with anxiety: Secondary | ICD-10-CM | POA: Diagnosis not present

## 2017-01-10 DIAGNOSIS — F4322 Adjustment disorder with anxiety: Secondary | ICD-10-CM | POA: Diagnosis not present

## 2017-01-26 DIAGNOSIS — F4322 Adjustment disorder with anxiety: Secondary | ICD-10-CM | POA: Diagnosis not present

## 2017-02-01 DIAGNOSIS — F4322 Adjustment disorder with anxiety: Secondary | ICD-10-CM | POA: Diagnosis not present

## 2017-02-09 DIAGNOSIS — F4322 Adjustment disorder with anxiety: Secondary | ICD-10-CM | POA: Diagnosis not present

## 2017-02-21 DIAGNOSIS — F4322 Adjustment disorder with anxiety: Secondary | ICD-10-CM | POA: Diagnosis not present

## 2017-03-08 DIAGNOSIS — F4322 Adjustment disorder with anxiety: Secondary | ICD-10-CM | POA: Diagnosis not present

## 2017-03-14 DIAGNOSIS — F4322 Adjustment disorder with anxiety: Secondary | ICD-10-CM | POA: Diagnosis not present

## 2017-03-28 DIAGNOSIS — F4322 Adjustment disorder with anxiety: Secondary | ICD-10-CM | POA: Diagnosis not present

## 2017-04-11 DIAGNOSIS — F4322 Adjustment disorder with anxiety: Secondary | ICD-10-CM | POA: Diagnosis not present

## 2017-04-25 DIAGNOSIS — F4322 Adjustment disorder with anxiety: Secondary | ICD-10-CM | POA: Diagnosis not present

## 2017-04-28 DIAGNOSIS — Z6841 Body Mass Index (BMI) 40.0 and over, adult: Secondary | ICD-10-CM | POA: Diagnosis not present

## 2017-04-28 DIAGNOSIS — R05 Cough: Secondary | ICD-10-CM | POA: Diagnosis not present

## 2017-04-28 DIAGNOSIS — J0100 Acute maxillary sinusitis, unspecified: Secondary | ICD-10-CM | POA: Diagnosis not present

## 2017-05-07 DIAGNOSIS — N898 Other specified noninflammatory disorders of vagina: Secondary | ICD-10-CM | POA: Diagnosis not present

## 2017-05-07 DIAGNOSIS — E8881 Metabolic syndrome: Secondary | ICD-10-CM | POA: Diagnosis not present

## 2017-05-07 DIAGNOSIS — Z658 Other specified problems related to psychosocial circumstances: Secondary | ICD-10-CM | POA: Diagnosis not present

## 2017-05-07 DIAGNOSIS — R05 Cough: Secondary | ICD-10-CM | POA: Diagnosis not present

## 2017-05-17 DIAGNOSIS — F4322 Adjustment disorder with anxiety: Secondary | ICD-10-CM | POA: Diagnosis not present

## 2017-05-31 DIAGNOSIS — F4322 Adjustment disorder with anxiety: Secondary | ICD-10-CM | POA: Diagnosis not present

## 2017-06-07 DIAGNOSIS — F4322 Adjustment disorder with anxiety: Secondary | ICD-10-CM | POA: Diagnosis not present

## 2017-06-11 DIAGNOSIS — F4322 Adjustment disorder with anxiety: Secondary | ICD-10-CM | POA: Diagnosis not present

## 2017-06-12 DIAGNOSIS — N938 Other specified abnormal uterine and vaginal bleeding: Secondary | ICD-10-CM | POA: Diagnosis not present

## 2017-06-27 DIAGNOSIS — F4322 Adjustment disorder with anxiety: Secondary | ICD-10-CM | POA: Diagnosis not present

## 2017-07-10 DIAGNOSIS — F4322 Adjustment disorder with anxiety: Secondary | ICD-10-CM | POA: Diagnosis not present

## 2017-07-11 DIAGNOSIS — E119 Type 2 diabetes mellitus without complications: Secondary | ICD-10-CM | POA: Diagnosis not present

## 2017-07-25 DIAGNOSIS — F4322 Adjustment disorder with anxiety: Secondary | ICD-10-CM | POA: Diagnosis not present

## 2017-07-31 DIAGNOSIS — R7303 Prediabetes: Secondary | ICD-10-CM | POA: Diagnosis not present

## 2017-07-31 DIAGNOSIS — N938 Other specified abnormal uterine and vaginal bleeding: Secondary | ICD-10-CM | POA: Diagnosis not present

## 2017-08-23 DIAGNOSIS — F4322 Adjustment disorder with anxiety: Secondary | ICD-10-CM | POA: Diagnosis not present

## 2017-09-06 DIAGNOSIS — F4322 Adjustment disorder with anxiety: Secondary | ICD-10-CM | POA: Diagnosis not present

## 2017-09-18 DIAGNOSIS — F4322 Adjustment disorder with anxiety: Secondary | ICD-10-CM | POA: Diagnosis not present

## 2017-10-02 DIAGNOSIS — R21 Rash and other nonspecific skin eruption: Secondary | ICD-10-CM | POA: Diagnosis not present

## 2017-10-02 DIAGNOSIS — Z79899 Other long term (current) drug therapy: Secondary | ICD-10-CM | POA: Diagnosis not present

## 2017-10-02 DIAGNOSIS — L509 Urticaria, unspecified: Secondary | ICD-10-CM | POA: Diagnosis not present

## 2017-10-02 DIAGNOSIS — Z7984 Long term (current) use of oral hypoglycemic drugs: Secondary | ICD-10-CM | POA: Diagnosis not present

## 2017-10-04 DIAGNOSIS — F4322 Adjustment disorder with anxiety: Secondary | ICD-10-CM | POA: Diagnosis not present

## 2017-10-11 DIAGNOSIS — R21 Rash and other nonspecific skin eruption: Secondary | ICD-10-CM | POA: Diagnosis not present

## 2017-10-11 DIAGNOSIS — N926 Irregular menstruation, unspecified: Secondary | ICD-10-CM | POA: Diagnosis not present

## 2017-10-23 DIAGNOSIS — R7309 Other abnormal glucose: Secondary | ICD-10-CM | POA: Diagnosis not present

## 2017-10-23 DIAGNOSIS — Z23 Encounter for immunization: Secondary | ICD-10-CM | POA: Diagnosis not present

## 2017-10-23 DIAGNOSIS — Z79899 Other long term (current) drug therapy: Secondary | ICD-10-CM | POA: Diagnosis not present

## 2017-10-23 DIAGNOSIS — E7800 Pure hypercholesterolemia, unspecified: Secondary | ICD-10-CM | POA: Diagnosis not present

## 2017-10-23 DIAGNOSIS — E559 Vitamin D deficiency, unspecified: Secondary | ICD-10-CM | POA: Diagnosis not present

## 2017-10-23 DIAGNOSIS — D509 Iron deficiency anemia, unspecified: Secondary | ICD-10-CM | POA: Diagnosis not present

## 2017-10-23 DIAGNOSIS — Z0000 Encounter for general adult medical examination without abnormal findings: Secondary | ICD-10-CM | POA: Diagnosis not present

## 2017-10-26 DIAGNOSIS — F4322 Adjustment disorder with anxiety: Secondary | ICD-10-CM | POA: Diagnosis not present

## 2017-10-29 DIAGNOSIS — N926 Irregular menstruation, unspecified: Secondary | ICD-10-CM | POA: Diagnosis not present

## 2017-10-29 DIAGNOSIS — E1165 Type 2 diabetes mellitus with hyperglycemia: Secondary | ICD-10-CM | POA: Diagnosis not present

## 2017-11-07 DIAGNOSIS — F4322 Adjustment disorder with anxiety: Secondary | ICD-10-CM | POA: Diagnosis not present

## 2017-11-10 DIAGNOSIS — H5711 Ocular pain, right eye: Secondary | ICD-10-CM | POA: Diagnosis not present

## 2017-11-10 DIAGNOSIS — Z6841 Body Mass Index (BMI) 40.0 and over, adult: Secondary | ICD-10-CM | POA: Diagnosis not present

## 2017-11-13 DIAGNOSIS — Z3202 Encounter for pregnancy test, result negative: Secondary | ICD-10-CM | POA: Diagnosis not present

## 2017-11-13 DIAGNOSIS — N920 Excessive and frequent menstruation with regular cycle: Secondary | ICD-10-CM | POA: Diagnosis not present

## 2017-11-13 DIAGNOSIS — D649 Anemia, unspecified: Secondary | ICD-10-CM | POA: Diagnosis not present

## 2017-11-21 DIAGNOSIS — D500 Iron deficiency anemia secondary to blood loss (chronic): Secondary | ICD-10-CM | POA: Diagnosis not present

## 2017-11-21 DIAGNOSIS — I10 Essential (primary) hypertension: Secondary | ICD-10-CM | POA: Diagnosis not present

## 2017-11-21 DIAGNOSIS — R5383 Other fatigue: Secondary | ICD-10-CM | POA: Diagnosis not present

## 2017-11-21 DIAGNOSIS — E782 Mixed hyperlipidemia: Secondary | ICD-10-CM | POA: Diagnosis not present

## 2017-11-28 DIAGNOSIS — F4322 Adjustment disorder with anxiety: Secondary | ICD-10-CM | POA: Diagnosis not present

## 2017-11-29 DIAGNOSIS — D509 Iron deficiency anemia, unspecified: Secondary | ICD-10-CM | POA: Diagnosis not present

## 2017-12-06 DIAGNOSIS — D509 Iron deficiency anemia, unspecified: Secondary | ICD-10-CM | POA: Diagnosis not present

## 2017-12-13 DIAGNOSIS — F4322 Adjustment disorder with anxiety: Secondary | ICD-10-CM | POA: Diagnosis not present

## 2018-01-15 DIAGNOSIS — N920 Excessive and frequent menstruation with regular cycle: Secondary | ICD-10-CM | POA: Diagnosis not present

## 2018-01-17 DIAGNOSIS — F4322 Adjustment disorder with anxiety: Secondary | ICD-10-CM | POA: Diagnosis not present

## 2018-02-13 DIAGNOSIS — F4322 Adjustment disorder with anxiety: Secondary | ICD-10-CM | POA: Diagnosis not present

## 2018-03-14 DIAGNOSIS — F4322 Adjustment disorder with anxiety: Secondary | ICD-10-CM | POA: Diagnosis not present

## 2018-03-22 DIAGNOSIS — D509 Iron deficiency anemia, unspecified: Secondary | ICD-10-CM | POA: Diagnosis not present

## 2018-03-28 DIAGNOSIS — F4322 Adjustment disorder with anxiety: Secondary | ICD-10-CM | POA: Diagnosis not present

## 2018-04-10 DIAGNOSIS — F4322 Adjustment disorder with anxiety: Secondary | ICD-10-CM | POA: Diagnosis not present

## 2018-07-11 DIAGNOSIS — F4322 Adjustment disorder with anxiety: Secondary | ICD-10-CM | POA: Diagnosis not present

## 2018-07-24 DIAGNOSIS — F4322 Adjustment disorder with anxiety: Secondary | ICD-10-CM | POA: Diagnosis not present

## 2018-08-29 DIAGNOSIS — F4322 Adjustment disorder with anxiety: Secondary | ICD-10-CM | POA: Diagnosis not present

## 2018-09-11 DIAGNOSIS — F4322 Adjustment disorder with anxiety: Secondary | ICD-10-CM | POA: Diagnosis not present

## 2018-10-02 DIAGNOSIS — F4322 Adjustment disorder with anxiety: Secondary | ICD-10-CM | POA: Diagnosis not present

## 2018-11-14 DIAGNOSIS — F4322 Adjustment disorder with anxiety: Secondary | ICD-10-CM | POA: Diagnosis not present

## 2018-12-05 DIAGNOSIS — I10 Essential (primary) hypertension: Secondary | ICD-10-CM | POA: Diagnosis not present

## 2018-12-05 DIAGNOSIS — Z79899 Other long term (current) drug therapy: Secondary | ICD-10-CM | POA: Diagnosis not present

## 2018-12-05 DIAGNOSIS — E1169 Type 2 diabetes mellitus with other specified complication: Secondary | ICD-10-CM | POA: Diagnosis not present

## 2018-12-05 DIAGNOSIS — D509 Iron deficiency anemia, unspecified: Secondary | ICD-10-CM | POA: Diagnosis not present

## 2018-12-05 DIAGNOSIS — E559 Vitamin D deficiency, unspecified: Secondary | ICD-10-CM | POA: Diagnosis not present

## 2018-12-10 DIAGNOSIS — Z0000 Encounter for general adult medical examination without abnormal findings: Secondary | ICD-10-CM | POA: Diagnosis not present

## 2018-12-10 DIAGNOSIS — Z23 Encounter for immunization: Secondary | ICD-10-CM | POA: Diagnosis not present

## 2018-12-17 DIAGNOSIS — F4322 Adjustment disorder with anxiety: Secondary | ICD-10-CM | POA: Diagnosis not present

## 2019-01-16 DIAGNOSIS — Z01419 Encounter for gynecological examination (general) (routine) without abnormal findings: Secondary | ICD-10-CM | POA: Diagnosis not present

## 2019-01-22 DIAGNOSIS — F4322 Adjustment disorder with anxiety: Secondary | ICD-10-CM | POA: Diagnosis not present

## 2019-01-29 DIAGNOSIS — M25512 Pain in left shoulder: Secondary | ICD-10-CM | POA: Diagnosis not present

## 2019-02-19 DIAGNOSIS — F4322 Adjustment disorder with anxiety: Secondary | ICD-10-CM | POA: Diagnosis not present

## 2019-03-31 DIAGNOSIS — Z20828 Contact with and (suspected) exposure to other viral communicable diseases: Secondary | ICD-10-CM | POA: Diagnosis not present

## 2019-04-03 DIAGNOSIS — Z20828 Contact with and (suspected) exposure to other viral communicable diseases: Secondary | ICD-10-CM | POA: Diagnosis not present

## 2019-04-03 DIAGNOSIS — Z03818 Encounter for observation for suspected exposure to other biological agents ruled out: Secondary | ICD-10-CM | POA: Diagnosis not present

## 2019-04-16 DIAGNOSIS — E119 Type 2 diabetes mellitus without complications: Secondary | ICD-10-CM | POA: Diagnosis not present

## 2019-04-29 DIAGNOSIS — I10 Essential (primary) hypertension: Secondary | ICD-10-CM | POA: Diagnosis not present

## 2019-04-29 DIAGNOSIS — E1169 Type 2 diabetes mellitus with other specified complication: Secondary | ICD-10-CM | POA: Diagnosis not present

## 2019-04-29 DIAGNOSIS — D72829 Elevated white blood cell count, unspecified: Secondary | ICD-10-CM | POA: Diagnosis not present

## 2019-05-19 DIAGNOSIS — M25531 Pain in right wrist: Secondary | ICD-10-CM | POA: Diagnosis not present

## 2019-06-17 DIAGNOSIS — M25511 Pain in right shoulder: Secondary | ICD-10-CM | POA: Diagnosis not present

## 2019-06-17 DIAGNOSIS — M79641 Pain in right hand: Secondary | ICD-10-CM | POA: Diagnosis not present

## 2019-07-01 DIAGNOSIS — R5382 Chronic fatigue, unspecified: Secondary | ICD-10-CM | POA: Diagnosis not present

## 2019-07-01 DIAGNOSIS — M2550 Pain in unspecified joint: Secondary | ICD-10-CM | POA: Diagnosis not present

## 2019-07-10 DIAGNOSIS — R5382 Chronic fatigue, unspecified: Secondary | ICD-10-CM | POA: Diagnosis not present

## 2019-07-10 DIAGNOSIS — Z79899 Other long term (current) drug therapy: Secondary | ICD-10-CM | POA: Diagnosis not present

## 2019-07-10 DIAGNOSIS — M0579 Rheumatoid arthritis with rheumatoid factor of multiple sites without organ or systems involvement: Secondary | ICD-10-CM | POA: Diagnosis not present

## 2019-07-10 DIAGNOSIS — M2550 Pain in unspecified joint: Secondary | ICD-10-CM | POA: Diagnosis not present

## 2019-09-01 DIAGNOSIS — M0579 Rheumatoid arthritis with rheumatoid factor of multiple sites without organ or systems involvement: Secondary | ICD-10-CM | POA: Diagnosis not present

## 2019-09-01 DIAGNOSIS — Z79899 Other long term (current) drug therapy: Secondary | ICD-10-CM | POA: Diagnosis not present

## 2019-09-01 DIAGNOSIS — M2550 Pain in unspecified joint: Secondary | ICD-10-CM | POA: Diagnosis not present

## 2019-09-02 ENCOUNTER — Other Ambulatory Visit: Payer: Self-pay | Admitting: Family Medicine

## 2019-09-02 DIAGNOSIS — Z1231 Encounter for screening mammogram for malignant neoplasm of breast: Secondary | ICD-10-CM

## 2019-09-12 ENCOUNTER — Other Ambulatory Visit: Payer: Self-pay

## 2019-09-12 ENCOUNTER — Ambulatory Visit
Admission: RE | Admit: 2019-09-12 | Discharge: 2019-09-12 | Disposition: A | Discharge status: Home / Self Care | Payer: BLUE CROSS/BLUE SHIELD | Source: Ambulatory Visit | Attending: Family Medicine | Admitting: Family Medicine

## 2019-09-12 DIAGNOSIS — Z1231 Encounter for screening mammogram for malignant neoplasm of breast: Secondary | ICD-10-CM | POA: Diagnosis not present

## 2019-10-06 DIAGNOSIS — Z79899 Other long term (current) drug therapy: Secondary | ICD-10-CM | POA: Diagnosis not present

## 2019-10-06 DIAGNOSIS — M0579 Rheumatoid arthritis with rheumatoid factor of multiple sites without organ or systems involvement: Secondary | ICD-10-CM | POA: Diagnosis not present

## 2019-10-06 DIAGNOSIS — M2550 Pain in unspecified joint: Secondary | ICD-10-CM | POA: Diagnosis not present

## 2019-11-24 DIAGNOSIS — M2550 Pain in unspecified joint: Secondary | ICD-10-CM | POA: Diagnosis not present

## 2019-11-24 DIAGNOSIS — M0579 Rheumatoid arthritis with rheumatoid factor of multiple sites without organ or systems involvement: Secondary | ICD-10-CM | POA: Diagnosis not present

## 2019-11-24 DIAGNOSIS — Z79899 Other long term (current) drug therapy: Secondary | ICD-10-CM | POA: Diagnosis not present

## 2019-11-24 DIAGNOSIS — M7660 Achilles tendinitis, unspecified leg: Secondary | ICD-10-CM | POA: Diagnosis not present

## 2019-12-08 ENCOUNTER — Ambulatory Visit (INDEPENDENT_AMBULATORY_CARE_PROVIDER_SITE_OTHER): Payer: BC Managed Care – PPO

## 2019-12-08 ENCOUNTER — Other Ambulatory Visit: Payer: Self-pay

## 2019-12-08 ENCOUNTER — Ambulatory Visit (INDEPENDENT_AMBULATORY_CARE_PROVIDER_SITE_OTHER): Payer: BC Managed Care – PPO | Admitting: Podiatry

## 2019-12-08 DIAGNOSIS — M779 Enthesopathy, unspecified: Secondary | ICD-10-CM | POA: Diagnosis not present

## 2019-12-08 DIAGNOSIS — M7730 Calcaneal spur, unspecified foot: Secondary | ICD-10-CM

## 2019-12-08 DIAGNOSIS — M7731 Calcaneal spur, right foot: Secondary | ICD-10-CM

## 2019-12-08 DIAGNOSIS — M79672 Pain in left foot: Secondary | ICD-10-CM

## 2019-12-08 DIAGNOSIS — M79671 Pain in right foot: Secondary | ICD-10-CM | POA: Diagnosis not present

## 2019-12-08 DIAGNOSIS — M7732 Calcaneal spur, left foot: Secondary | ICD-10-CM

## 2019-12-08 NOTE — Patient Instructions (Addendum)
If was nice to meet you today. If you have any questions or any further concerns, please feel fee to give me a call. You can call our office at [phone] or please feel fee to send me a message through MyChart.    I would also use Voltaren gel to the back of the heel as well   For instructions on how to put on your Night Splint, please visit [email or web address]  Achilles Tendinitis  with Rehab Achilles tendinitis is a disorder of the Achilles tendon. The Achilles tendon connects the large calf muscles (Gastrocnemius and Soleus) to the heel bone (calcaneus). This tendon is sometimes called the heel cord. It is important for pushing-off and standing on your toes and is important for walking, running, or jumping. Tendinitis is often caused by overuse and repetitive microtrauma. SYMPTOMS  Pain, tenderness, swelling, warmth, and redness may occur over the Achilles tendon even at rest.  Pain with pushing off, or flexing or extending the ankle.  Pain that is worsened after or during activity. CAUSES   Overuse sometimes seen with rapid increase in exercise programs or in sports requiring running and jumping.  Poor physical conditioning (strength and flexibility or endurance).  Running sports, especially training running down hills.  Inadequate warm-up before practice or play or failure to stretch before participation.  Injury to the tendon. PREVENTION   Warm up and stretch before practice or competition.  Allow time for adequate rest and recovery between practices and competition.  Keep up conditioning.  Keep up ankle and leg flexibility.  Improve or keep muscle strength and endurance.  Improve cardiovascular fitness.  Use proper technique.  Use proper equipment (shoes, skates).  To help prevent recurrence, taping, protective strapping, or an adhesive bandage may be recommended for several weeks after healing is complete. PROGNOSIS   Recovery may take weeks to  several months to heal.  Longer recovery is expected if symptoms have been prolonged.  Recovery is usually quicker if the inflammation is due to a direct blow as compared with overuse or sudden strain. RELATED COMPLICATIONS   Healing time will be prolonged if the condition is not correctly treated. The injury must be given plenty of time to heal.  Symptoms can reoccur if activity is resumed too soon.  Untreated, tendinitis may increase the risk of tendon rupture requiring additional time for recovery and possibly surgery. TREATMENT   The first treatment consists of rest anti-inflammatory medication, and ice to relieve the pain.  Stretching and strengthening exercises after resolution of pain will likely help reduce the risk of recurrence. Referral to a physical therapist or athletic trainer for further evaluation and treatment may be helpful.  A walking boot or cast may be recommended to rest the Achilles tendon. This can help break the cycle of inflammation and microtrauma.  Arch supports (orthotics) may be prescribed or recommended by your caregiver as an adjunct to therapy and rest.  Surgery to remove the inflamed tendon lining or degenerated tendon tissue is rarely necessary and has shown less than predictable results. MEDICATION   Nonsteroidal anti-inflammatory medications, such as aspirin and ibuprofen, may be used for pain and inflammation relief. Do not take within 7 days before surgery. Take these as directed by your caregiver. Contact your caregiver immediately if any bleeding, stomach upset, or signs of allergic reaction occur. Other minor pain relievers, such as acetaminophen, may also be used.  Pain relievers may be prescribed as necessary by your caregiver. Do not take prescription pain medication for  longer than 4 to 7 days. Use only as directed and only as much as you need.  Cortisone injections are rarely indicated. Cortisone injections may weaken tendons and predispose  to rupture. It is better to give the condition more time to heal than to use them. HEAT AND COLD  Cold is used to relieve pain and reduce inflammation for acute and chronic Achilles tendinitis. Cold should be applied for 10 to 15 minutes every 2 to 3 hours for inflammation and pain and immediately after any activity that aggravates your symptoms. Use ice packs or an ice massage.  Heat may be used before performing stretching and strengthening activities prescribed by your caregiver. Use a heat pack or a warm soak. SEEK MEDICAL CARE IF:  Symptoms get worse or do not improve in 2 weeks despite treatment.  New, unexplained symptoms develop. Drugs used in treatment may produce side effects.  EXERCISES:  RANGE OF MOTION (ROM) AND STRETCHING EXERCISES - Achilles Tendinitis  These exercises may help you when beginning to rehabilitate your injury. Your symptoms may resolve with or without further involvement from your physician, physical therapist or athletic trainer. While completing these exercises, remember:   Restoring tissue flexibility helps normal motion to return to the joints. This allows healthier, less painful movement and activity.  An effective stretch should be held for at least 30 seconds.  A stretch should never be painful. You should only feel a gentle lengthening or release in the stretched tissue.  STRETCH  Gastroc, Standing   Place hands on wall.  Extend right / left leg, keeping the front knee somewhat bent.  Slightly point your toes inward on your back foot.  Keeping your right / left heel on the floor and your knee straight, shift your weight toward the wall, not allowing your back to arch.  You should feel a gentle stretch in the right / left calf. Hold this position for 10 seconds. Repeat 3 times. Complete this stretch 2 times per day.  STRETCH  Soleus, Standing   Place hands on wall.  Extend right / left leg, keeping the other knee somewhat bent.  Slightly  point your toes inward on your back foot.  Keep your right / left heel on the floor, bend your back knee, and slightly shift your weight over the back leg so that you feel a gentle stretch deep in your back calf.  Hold this position for 10 seconds. Repeat 3 times. Complete this stretch 2 times per day.  STRETCH  Gastrocsoleus, Standing  Note: This exercise can place a lot of stress on your foot and ankle. Please complete this exercise only if specifically instructed by your caregiver.   Place the ball of your right / left foot on a step, keeping your other foot firmly on the same step.  Hold on to the wall or a rail for balance.  Slowly lift your other foot, allowing your body weight to press your heel down over the edge of the step.  You should feel a stretch in your right / left calf.  Hold this position for 10 seconds.  Repeat this exercise with a slight bend in your knee. Repeat 3 times. Complete this stretch 2 times per day.   STRENGTHENING EXERCISES - Achilles Tendinitis These exercises may help you when beginning to rehabilitate your injury. They may resolve your symptoms with or without further involvement from your physician, physical therapist or athletic trainer. While completing these exercises, remember:   Muscles can gain  both the endurance and the strength needed for everyday activities through controlled exercises.  Complete these exercises as instructed by your physician, physical therapist or athletic trainer. Progress the resistance and repetitions only as guided.  You may experience muscle soreness or fatigue, but the pain or discomfort you are trying to eliminate should never worsen during these exercises. If this pain does worsen, stop and make certain you are following the directions exactly. If the pain is still present after adjustments, discontinue the exercise until you can discuss the trouble with your clinician.  STRENGTH - Plantar-flexors   Sit with  your right / left leg extended. Holding onto both ends of a rubber exercise band/tubing, loop it around the ball of your foot. Keep a slight tension in the band.  Slowly push your toes away from you, pointing them downward.  Hold this position for 10 seconds. Return slowly, controlling the tension in the band/tubing. Repeat 3 times. Complete this exercise 2 times per day.   STRENGTH - Plantar-flexors   Stand with your feet shoulder width apart. Steady yourself with a wall or table using as little support as needed.  Keeping your weight evenly spread over the width of your feet, rise up on your toes.*  Hold this position for 10 seconds. Repeat 3 times. Complete this exercise 2 times per day.  *If this is too easy, shift your weight toward your right / left leg until you feel challenged. Ultimately, you may be asked to do this exercise with your right / left foot only.  STRENGTH  Plantar-flexors, Eccentric  Note: This exercise can place a lot of stress on your foot and ankle. Please complete this exercise only if specifically instructed by your caregiver.   Place the balls of your feet on a step. With your hands, use only enough support from a wall or rail to keep your balance.  Keep your knees straight and rise up on your toes.  Slowly shift your weight entirely to your right / left toes and pick up your opposite foot. Gently and with controlled movement, lower your weight through your right / left foot so that your heel drops below the level of the step. You will feel a slight stretch in the back of your calf at the end position.  Use the healthy leg to help rise up onto the balls of both feet, then lower weight only on the right / left leg again. Build up to 15 repetitions. Then progress to 3 consecutive sets of 15 repetitions.*  After completing the above exercise, complete the same exercise with a slight knee bend (about 30 degrees). Again, build up to 15 repetitions. Then progress to  3 consecutive sets of 15 repetitions.* Perform this exercise 2 times per day.  *When you easily complete 3 sets of 15, your physician, physical therapist or athletic trainer may advise you to add resistance by wearing a backpack filled with additional weight.  STRENGTH - Plantar Flexors, Seated   Sit on a chair that allows your feet to rest flat on the ground. If necessary, sit at the edge of the chair.  Keeping your toes firmly on the ground, lift your right / left heel as far as you can without increasing any discomfort in your ankle. Repeat 3 times. Complete this exercise 2 times a day.

## 2019-12-09 NOTE — Progress Notes (Signed)
Subjective:   Patient ID: Sarah Frey, female   DOB: 39 y.o.   MRN: [id number]   HPI 39 year old female presents the office today for concerns of bilateral heel pain.  She states that she saw her rheumatologist and she was diagnosed with bone spurs on the heels.  She states that she feels the tendon on the back of her leg is stiff when she walks.  She has pain in the morning when she first gets up after being on her feet.  This is been ongoing about a year.  Pain is intermittent.  She describes a burning, sharp, stiff discomfort to the back of her heel.  She is tenderness eventually she reports that she has no other concerns today.   Review of Systems  All other systems reviewed and are negative.  Past Medical History:  Diagnosis Date  . Hyperlipidemia   . Hypertension     No past surgical history on file.   Current Outpatient Medications:  .  cyclobenzaprine (FLEXERIL) 10 MG tablet, Take by mouth., Disp: , Rfl:  .  norethindrone (HEATHER) 0.35 MG tablet, , Disp: , Rfl:  .  aspirin 81 MG tablet, Take 81 mg by mouth daily., Disp: , Rfl:  .  Aspirin-Calcium Carbonate 81-777 MG TABS, Take by mouth., Disp: , Rfl:  .  Biotin 10 MG CAPS, Take by mouth., Disp: , Rfl:  .  fexofenadine (ALLEGRA) 30 MG tablet, Take 30 mg by mouth daily., Disp: , Rfl:  .  folic acid (FOLVITE) 1 MG tablet, Take 1 mg by mouth daily., Disp: , Rfl:  .  lisinopril (PRINIVIL,ZESTRIL) 40 MG tablet, Take 40 mg by mouth daily., Disp: , Rfl:  .  meloxicam (MOBIC) 15 MG tablet, Take 15 mg by mouth daily., Disp: , Rfl:  .  metFORMIN (GLUCOPHAGE) 500 MG tablet, Take 500 mg by mouth 2 (two) times daily with a meal., Disp: , Rfl:  .  methotrexate 50 MG/2ML injection, Inject into the skin., Disp: , Rfl:  .  NON FORMULARY, Iron capsule once daily, Disp: , Rfl:  .  pantoprazole (PROTONIX) 40 MG tablet, Take 1 tablet (40 mg total) by mouth daily. An appt is needed for any future refills, please call the office., Disp: 30 tablet,  Rfl: 0 .  predniSONE (DELTASONE) 5 MG tablet, Take by mouth., Disp: , Rfl:  .  simvastatin (ZOCOR) 40 MG tablet, Take 40 mg by mouth daily., Disp: , Rfl:   Allergies  Allergen Reactions  . Minocycline     PSEUDOTUMOR. WAS TAKING FOR ACNE  Pseudo tumor          Objective:  Physical Exam  General: AAO x3, NAD  Dermatological: Skin is warm, dry and supple bilateral. Nails x 10 are well manicured; remaining integument appears unremarkable at this time. There are no open sores, no preulcerative lesions, no rash or signs of infection present.  Vascular: Dorsalis Pedis artery and Posterior Tibial artery pedal pulses are 2/4 bilateral with immedate capillary fill time.There is no pain with calf compression, swelling, warmth, erythema.   Neruologic: Grossly intact via light touch bilateral.  Negative Tinel sign.  Musculoskeletal: There is tenderness palpation of the posterior aspect the calcaneus and the dorsum plantar fascial area prominent bone spurs.  Mild discomfort also noted along the distal portion Achilles tendon.  Achilles tendon appears to be intact.  There is minimal discomfort on the plantar aspect of the calcaneus insertion of plantar fascia.  There is no pain with lateral compression  of calcaneus.  Equinus is noted.  No edema, erythema today.  Muscular strength 5/5 in all groups tested bilateral.  Gait: Unassisted, Nonantalgic.       Assessment:   Bilateral Achilles tendinitis, heel spur     Plan:  -Treatment options discussed including all alternatives, risks, and complications -Etiology of symptoms were discussed -X-rays were obtained and reviewed with the patient.  Left heel spur on posterior aspect the calcaneus.  Mild inferior calcaneal spurring is evident.  No evidence of acute fracture. -We discussed with conservative as well as surgical treatment options.  At this time she has not had conservative treatments for the start with this.  Discussed stretching, icing  daily.  She is to start home exercises but if needed continue formal physical therapy.  She can use Voltaren gel to the area as well.  Night splint was dispensed.  Heel lift was also applied to her shoes.  Vivi Barrack DPM

## 2020-01-29 DIAGNOSIS — R399 Unspecified symptoms and signs involving the genitourinary system: Secondary | ICD-10-CM | POA: Diagnosis not present

## 2020-02-09 ENCOUNTER — Ambulatory Visit: Payer: BC Managed Care – PPO | Admitting: Podiatry

## 2020-07-01 ENCOUNTER — Encounter: Payer: Self-pay | Admitting: Cardiology

## 2020-11-12 ENCOUNTER — Other Ambulatory Visit: Payer: Self-pay | Admitting: Obstetrics and Gynecology

## 2020-11-12 DIAGNOSIS — Z1231 Encounter for screening mammogram for malignant neoplasm of breast: Secondary | ICD-10-CM

## 2020-11-20 ENCOUNTER — Ambulatory Visit
Admission: RE | Admit: 2020-11-20 | Discharge: 2020-11-20 | Disposition: A | Discharge status: Home / Self Care | Payer: BC Managed Care – PPO | Source: Ambulatory Visit | Attending: Obstetrics and Gynecology | Admitting: Obstetrics and Gynecology

## 2020-11-20 DIAGNOSIS — Z1231 Encounter for screening mammogram for malignant neoplasm of breast: Secondary | ICD-10-CM

## 2021-02-15 DIAGNOSIS — R7989 Other specified abnormal findings of blood chemistry: Secondary | ICD-10-CM | POA: Diagnosis not present

## 2021-02-15 DIAGNOSIS — M7660 Achilles tendinitis, unspecified leg: Secondary | ICD-10-CM | POA: Diagnosis not present

## 2021-02-15 DIAGNOSIS — M0579 Rheumatoid arthritis with rheumatoid factor of multiple sites without organ or systems involvement: Secondary | ICD-10-CM | POA: Diagnosis not present

## 2021-02-15 DIAGNOSIS — Z6841 Body Mass Index (BMI) 40.0 and over, adult: Secondary | ICD-10-CM | POA: Diagnosis not present

## 2021-02-15 DIAGNOSIS — M2550 Pain in unspecified joint: Secondary | ICD-10-CM | POA: Diagnosis not present

## 2021-02-15 DIAGNOSIS — R5383 Other fatigue: Secondary | ICD-10-CM | POA: Diagnosis not present

## 2021-02-15 DIAGNOSIS — Z79899 Other long term (current) drug therapy: Secondary | ICD-10-CM | POA: Diagnosis not present

## 2021-03-08 DIAGNOSIS — Z3041 Encounter for surveillance of contraceptive pills: Secondary | ICD-10-CM | POA: Diagnosis not present

## 2021-03-08 DIAGNOSIS — Z01419 Encounter for gynecological examination (general) (routine) without abnormal findings: Secondary | ICD-10-CM | POA: Diagnosis not present

## 2021-03-14 DIAGNOSIS — E1165 Type 2 diabetes mellitus with hyperglycemia: Secondary | ICD-10-CM | POA: Diagnosis not present

## 2021-03-14 DIAGNOSIS — E559 Vitamin D deficiency, unspecified: Secondary | ICD-10-CM | POA: Diagnosis not present

## 2021-03-14 DIAGNOSIS — Z79899 Other long term (current) drug therapy: Secondary | ICD-10-CM | POA: Diagnosis not present

## 2021-03-14 DIAGNOSIS — E7800 Pure hypercholesterolemia, unspecified: Secondary | ICD-10-CM | POA: Diagnosis not present

## 2021-03-14 DIAGNOSIS — Z7984 Long term (current) use of oral hypoglycemic drugs: Secondary | ICD-10-CM | POA: Diagnosis not present

## 2021-05-16 DIAGNOSIS — Z79899 Other long term (current) drug therapy: Secondary | ICD-10-CM | POA: Diagnosis not present

## 2021-05-16 DIAGNOSIS — M7660 Achilles tendinitis, unspecified leg: Secondary | ICD-10-CM | POA: Diagnosis not present

## 2021-05-16 DIAGNOSIS — M2550 Pain in unspecified joint: Secondary | ICD-10-CM | POA: Diagnosis not present

## 2021-05-16 DIAGNOSIS — Z6841 Body Mass Index (BMI) 40.0 and over, adult: Secondary | ICD-10-CM | POA: Diagnosis not present

## 2021-05-16 DIAGNOSIS — R7989 Other specified abnormal findings of blood chemistry: Secondary | ICD-10-CM | POA: Diagnosis not present

## 2021-05-16 DIAGNOSIS — M0579 Rheumatoid arthritis with rheumatoid factor of multiple sites without organ or systems involvement: Secondary | ICD-10-CM | POA: Diagnosis not present

## 2021-05-16 DIAGNOSIS — R5383 Other fatigue: Secondary | ICD-10-CM | POA: Diagnosis not present

## 2021-05-23 DIAGNOSIS — R110 Nausea: Secondary | ICD-10-CM | POA: Diagnosis not present

## 2021-05-23 DIAGNOSIS — Z789 Other specified health status: Secondary | ICD-10-CM | POA: Diagnosis not present

## 2021-05-23 DIAGNOSIS — Z6841 Body Mass Index (BMI) 40.0 and over, adult: Secondary | ICD-10-CM | POA: Diagnosis not present

## 2021-05-23 DIAGNOSIS — R5383 Other fatigue: Secondary | ICD-10-CM | POA: Diagnosis not present

## 2021-05-24 ENCOUNTER — Other Ambulatory Visit: Payer: Self-pay | Admitting: Family Medicine

## 2021-05-24 ENCOUNTER — Ambulatory Visit
Admission: RE | Admit: 2021-05-24 | Discharge: 2021-05-24 | Disposition: A | Discharge status: Home / Self Care | Payer: 59 | Source: Ambulatory Visit | Attending: Family Medicine | Admitting: Family Medicine

## 2021-05-24 DIAGNOSIS — S6991XA Unspecified injury of right wrist, hand and finger(s), initial encounter: Secondary | ICD-10-CM

## 2021-06-03 DIAGNOSIS — E119 Type 2 diabetes mellitus without complications: Secondary | ICD-10-CM | POA: Diagnosis not present

## 2021-08-15 DIAGNOSIS — Z79899 Other long term (current) drug therapy: Secondary | ICD-10-CM | POA: Diagnosis not present

## 2021-08-15 DIAGNOSIS — M0579 Rheumatoid arthritis with rheumatoid factor of multiple sites without organ or systems involvement: Secondary | ICD-10-CM | POA: Diagnosis not present

## 2021-08-15 DIAGNOSIS — R7989 Other specified abnormal findings of blood chemistry: Secondary | ICD-10-CM | POA: Diagnosis not present

## 2021-08-15 DIAGNOSIS — Z6841 Body Mass Index (BMI) 40.0 and over, adult: Secondary | ICD-10-CM | POA: Diagnosis not present

## 2021-08-15 DIAGNOSIS — M7660 Achilles tendinitis, unspecified leg: Secondary | ICD-10-CM | POA: Diagnosis not present

## 2021-08-15 DIAGNOSIS — M2550 Pain in unspecified joint: Secondary | ICD-10-CM | POA: Diagnosis not present

## 2021-08-15 DIAGNOSIS — R5383 Other fatigue: Secondary | ICD-10-CM | POA: Diagnosis not present

## 2021-08-30 DIAGNOSIS — R7989 Other specified abnormal findings of blood chemistry: Secondary | ICD-10-CM | POA: Diagnosis not present

## 2021-11-04 ENCOUNTER — Other Ambulatory Visit: Payer: Self-pay | Admitting: Obstetrics and Gynecology

## 2021-11-04 ENCOUNTER — Other Ambulatory Visit: Payer: Self-pay | Admitting: Family Medicine

## 2021-11-04 DIAGNOSIS — Z1231 Encounter for screening mammogram for malignant neoplasm of breast: Secondary | ICD-10-CM

## 2021-11-14 DIAGNOSIS — M0579 Rheumatoid arthritis with rheumatoid factor of multiple sites without organ or systems involvement: Secondary | ICD-10-CM | POA: Diagnosis not present

## 2021-11-21 ENCOUNTER — Ambulatory Visit
Admission: RE | Admit: 2021-11-21 | Discharge: 2021-11-21 | Disposition: A | Discharge status: Home / Self Care | Payer: 59 | Source: Ambulatory Visit | Attending: Family Medicine | Admitting: Family Medicine

## 2021-11-21 DIAGNOSIS — Z1231 Encounter for screening mammogram for malignant neoplasm of breast: Secondary | ICD-10-CM

## 2022-02-15 DIAGNOSIS — Z79899 Other long term (current) drug therapy: Secondary | ICD-10-CM | POA: Diagnosis not present

## 2022-02-15 DIAGNOSIS — M7660 Achilles tendinitis, unspecified leg: Secondary | ICD-10-CM | POA: Diagnosis not present

## 2022-02-15 DIAGNOSIS — R5383 Other fatigue: Secondary | ICD-10-CM | POA: Diagnosis not present

## 2022-02-15 DIAGNOSIS — M0579 Rheumatoid arthritis with rheumatoid factor of multiple sites without organ or systems involvement: Secondary | ICD-10-CM | POA: Diagnosis not present

## 2022-02-15 DIAGNOSIS — R7989 Other specified abnormal findings of blood chemistry: Secondary | ICD-10-CM | POA: Diagnosis not present

## 2022-02-15 DIAGNOSIS — M2550 Pain in unspecified joint: Secondary | ICD-10-CM | POA: Diagnosis not present

## 2022-02-15 DIAGNOSIS — Z6841 Body Mass Index (BMI) 40.0 and over, adult: Secondary | ICD-10-CM | POA: Diagnosis not present

## 2022-03-14 DIAGNOSIS — Z3041 Encounter for surveillance of contraceptive pills: Secondary | ICD-10-CM | POA: Diagnosis not present

## 2022-03-14 DIAGNOSIS — Z01419 Encounter for gynecological examination (general) (routine) without abnormal findings: Secondary | ICD-10-CM | POA: Diagnosis not present

## 2022-05-17 DIAGNOSIS — M0579 Rheumatoid arthritis with rheumatoid factor of multiple sites without organ or systems involvement: Secondary | ICD-10-CM | POA: Diagnosis not present

## 2022-06-26 DIAGNOSIS — Z833 Family history of diabetes mellitus: Secondary | ICD-10-CM | POA: Diagnosis not present

## 2022-06-26 DIAGNOSIS — Z7984 Long term (current) use of oral hypoglycemic drugs: Secondary | ICD-10-CM | POA: Diagnosis not present

## 2022-06-26 DIAGNOSIS — E119 Type 2 diabetes mellitus without complications: Secondary | ICD-10-CM | POA: Diagnosis not present

## 2022-06-26 DIAGNOSIS — E785 Hyperlipidemia, unspecified: Secondary | ICD-10-CM | POA: Diagnosis not present

## 2022-06-26 DIAGNOSIS — K219 Gastro-esophageal reflux disease without esophagitis: Secondary | ICD-10-CM | POA: Diagnosis not present

## 2022-06-26 DIAGNOSIS — M069 Rheumatoid arthritis, unspecified: Secondary | ICD-10-CM | POA: Diagnosis not present

## 2022-06-26 DIAGNOSIS — Z794 Long term (current) use of insulin: Secondary | ICD-10-CM | POA: Diagnosis not present

## 2022-06-26 DIAGNOSIS — Z809 Family history of malignant neoplasm, unspecified: Secondary | ICD-10-CM | POA: Diagnosis not present

## 2022-06-26 DIAGNOSIS — I10 Essential (primary) hypertension: Secondary | ICD-10-CM | POA: Diagnosis not present

## 2022-06-26 DIAGNOSIS — Z8249 Family history of ischemic heart disease and other diseases of the circulatory system: Secondary | ICD-10-CM | POA: Diagnosis not present

## 2022-06-26 DIAGNOSIS — Z825 Family history of asthma and other chronic lower respiratory diseases: Secondary | ICD-10-CM | POA: Diagnosis not present

## 2022-06-26 DIAGNOSIS — Z7982 Long term (current) use of aspirin: Secondary | ICD-10-CM | POA: Diagnosis not present

## 2022-08-16 DIAGNOSIS — Z6841 Body Mass Index (BMI) 40.0 and over, adult: Secondary | ICD-10-CM | POA: Diagnosis not present

## 2022-08-16 DIAGNOSIS — R5383 Other fatigue: Secondary | ICD-10-CM | POA: Diagnosis not present

## 2022-08-16 DIAGNOSIS — M7660 Achilles tendinitis, unspecified leg: Secondary | ICD-10-CM | POA: Diagnosis not present

## 2022-08-16 DIAGNOSIS — R7989 Other specified abnormal findings of blood chemistry: Secondary | ICD-10-CM | POA: Diagnosis not present

## 2022-08-16 DIAGNOSIS — Z79899 Other long term (current) drug therapy: Secondary | ICD-10-CM | POA: Diagnosis not present

## 2022-08-16 DIAGNOSIS — M0579 Rheumatoid arthritis with rheumatoid factor of multiple sites without organ or systems involvement: Secondary | ICD-10-CM | POA: Diagnosis not present

## 2022-10-18 DIAGNOSIS — M7660 Achilles tendinitis, unspecified leg: Secondary | ICD-10-CM | POA: Diagnosis not present

## 2022-10-18 DIAGNOSIS — Z79899 Other long term (current) drug therapy: Secondary | ICD-10-CM | POA: Diagnosis not present

## 2022-10-18 DIAGNOSIS — Z6841 Body Mass Index (BMI) 40.0 and over, adult: Secondary | ICD-10-CM | POA: Diagnosis not present

## 2022-10-18 DIAGNOSIS — R5383 Other fatigue: Secondary | ICD-10-CM | POA: Diagnosis not present

## 2022-10-18 DIAGNOSIS — M0579 Rheumatoid arthritis with rheumatoid factor of multiple sites without organ or systems involvement: Secondary | ICD-10-CM | POA: Diagnosis not present

## 2022-10-18 DIAGNOSIS — R7989 Other specified abnormal findings of blood chemistry: Secondary | ICD-10-CM | POA: Diagnosis not present

## 2022-11-23 ENCOUNTER — Other Ambulatory Visit: Payer: Self-pay | Admitting: Family Medicine

## 2022-11-23 DIAGNOSIS — Z1231 Encounter for screening mammogram for malignant neoplasm of breast: Secondary | ICD-10-CM

## 2022-12-15 ENCOUNTER — Ambulatory Visit
Admission: RE | Admit: 2022-12-15 | Discharge: 2022-12-15 | Disposition: A | Discharge status: Home / Self Care | Payer: 59 | Source: Ambulatory Visit | Attending: Family Medicine | Admitting: Family Medicine

## 2022-12-15 DIAGNOSIS — Z1231 Encounter for screening mammogram for malignant neoplasm of breast: Secondary | ICD-10-CM

## 2023-01-17 DIAGNOSIS — Z79899 Other long term (current) drug therapy: Secondary | ICD-10-CM | POA: Diagnosis not present

## 2023-01-17 DIAGNOSIS — Z6841 Body Mass Index (BMI) 40.0 and over, adult: Secondary | ICD-10-CM | POA: Diagnosis not present

## 2023-01-17 DIAGNOSIS — R5383 Other fatigue: Secondary | ICD-10-CM | POA: Diagnosis not present

## 2023-01-17 DIAGNOSIS — R7989 Other specified abnormal findings of blood chemistry: Secondary | ICD-10-CM | POA: Diagnosis not present

## 2023-01-17 DIAGNOSIS — M0579 Rheumatoid arthritis with rheumatoid factor of multiple sites without organ or systems involvement: Secondary | ICD-10-CM | POA: Diagnosis not present

## 2023-01-17 DIAGNOSIS — M7660 Achilles tendinitis, unspecified leg: Secondary | ICD-10-CM | POA: Diagnosis not present

## 2023-02-16 DIAGNOSIS — R051 Acute cough: Secondary | ICD-10-CM | POA: Diagnosis not present

## 2023-02-16 DIAGNOSIS — R030 Elevated blood-pressure reading, without diagnosis of hypertension: Secondary | ICD-10-CM | POA: Diagnosis not present

## 2023-02-16 DIAGNOSIS — J3489 Other specified disorders of nose and nasal sinuses: Secondary | ICD-10-CM | POA: Diagnosis not present

## 2023-02-26 DIAGNOSIS — J019 Acute sinusitis, unspecified: Secondary | ICD-10-CM | POA: Diagnosis not present

## 2023-02-26 DIAGNOSIS — J189 Pneumonia, unspecified organism: Secondary | ICD-10-CM | POA: Diagnosis not present

## 2023-03-03 DIAGNOSIS — J189 Pneumonia, unspecified organism: Secondary | ICD-10-CM | POA: Diagnosis not present

## 2023-03-23 DIAGNOSIS — Z3041 Encounter for surveillance of contraceptive pills: Secondary | ICD-10-CM | POA: Diagnosis not present

## 2023-03-23 DIAGNOSIS — Z01419 Encounter for gynecological examination (general) (routine) without abnormal findings: Secondary | ICD-10-CM | POA: Diagnosis not present

## 2023-03-23 DIAGNOSIS — Z124 Encounter for screening for malignant neoplasm of cervix: Secondary | ICD-10-CM | POA: Diagnosis not present

## 2023-04-18 DIAGNOSIS — M0579 Rheumatoid arthritis with rheumatoid factor of multiple sites without organ or systems involvement: Secondary | ICD-10-CM | POA: Diagnosis not present

## 2023-04-18 DIAGNOSIS — Z79899 Other long term (current) drug therapy: Secondary | ICD-10-CM | POA: Diagnosis not present

## 2023-04-18 DIAGNOSIS — M7660 Achilles tendinitis, unspecified leg: Secondary | ICD-10-CM | POA: Diagnosis not present

## 2023-04-18 DIAGNOSIS — Z6841 Body Mass Index (BMI) 40.0 and over, adult: Secondary | ICD-10-CM | POA: Diagnosis not present

## 2023-04-18 DIAGNOSIS — R5383 Other fatigue: Secondary | ICD-10-CM | POA: Diagnosis not present

## 2023-04-18 DIAGNOSIS — R7989 Other specified abnormal findings of blood chemistry: Secondary | ICD-10-CM | POA: Diagnosis not present

## 2023-07-19 DIAGNOSIS — M0579 Rheumatoid arthritis with rheumatoid factor of multiple sites without organ or systems involvement: Secondary | ICD-10-CM | POA: Diagnosis not present

## 2023-08-27 IMAGING — MG MM DIGITAL SCREENING BILAT W/ TOMO AND CAD
6 of 10 series · 6 of 30 positions shown · non-contrast
Comparison: Previous exam(s).

CLINICAL DATA: Screening.

EXAM:
DIGITAL SCREENING BILATERAL MAMMOGRAM WITH TOMOSYNTHESIS AND CAD
TECHNIQUE: Bilateral screening digital craniocaudal and mediolateral oblique
mammograms were obtained. Bilateral screening digital breast
tomosynthesis was performed. The images were evaluated with
computer-aided detection.

[R MLO synth-2D (1 of 2)]
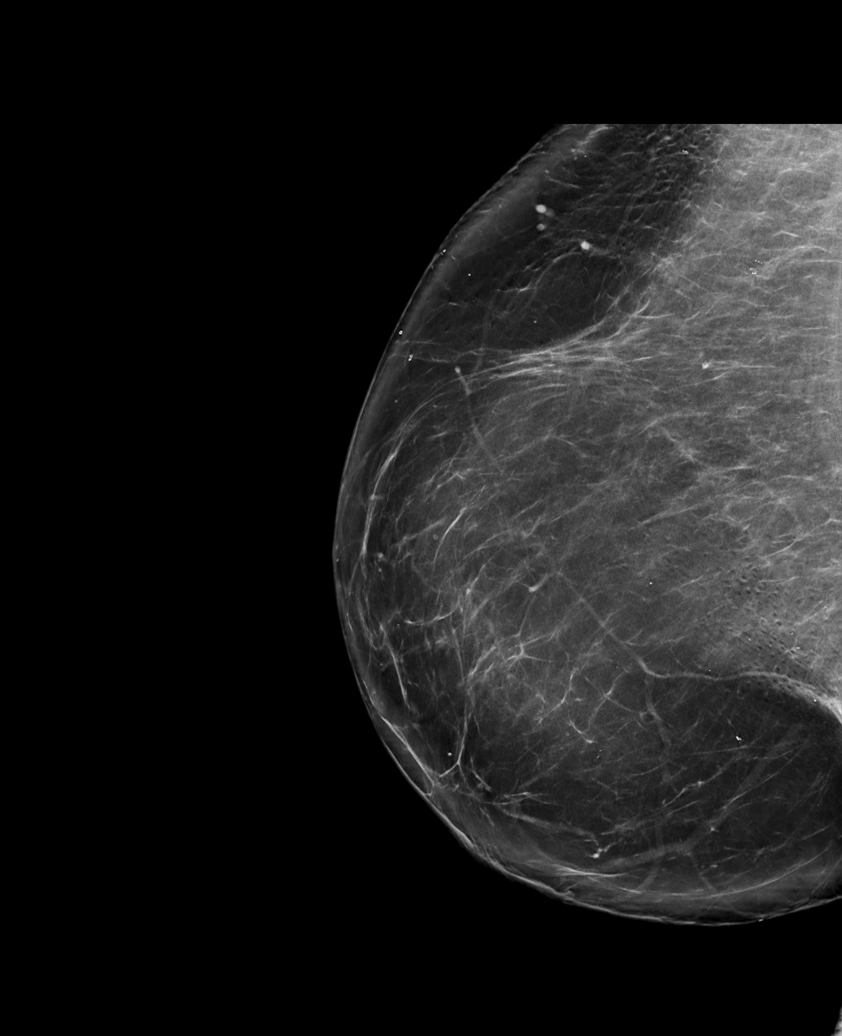

[L CC synth-2D]
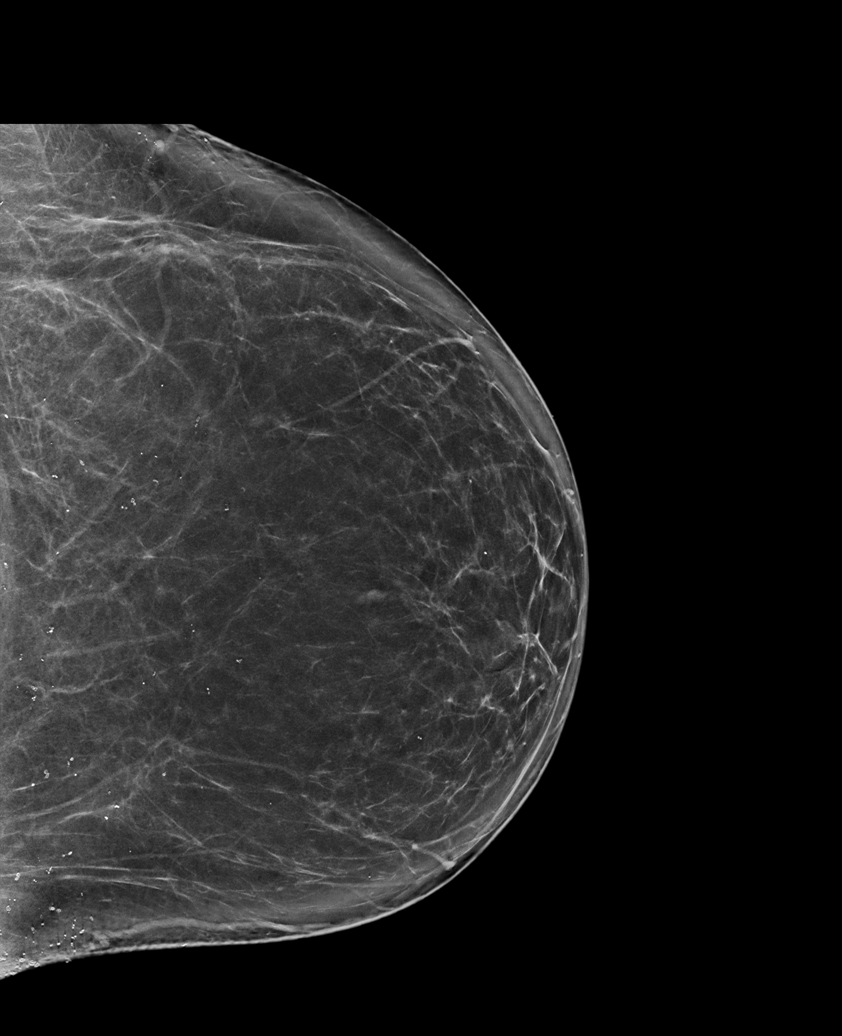

[R CC synth-2D]
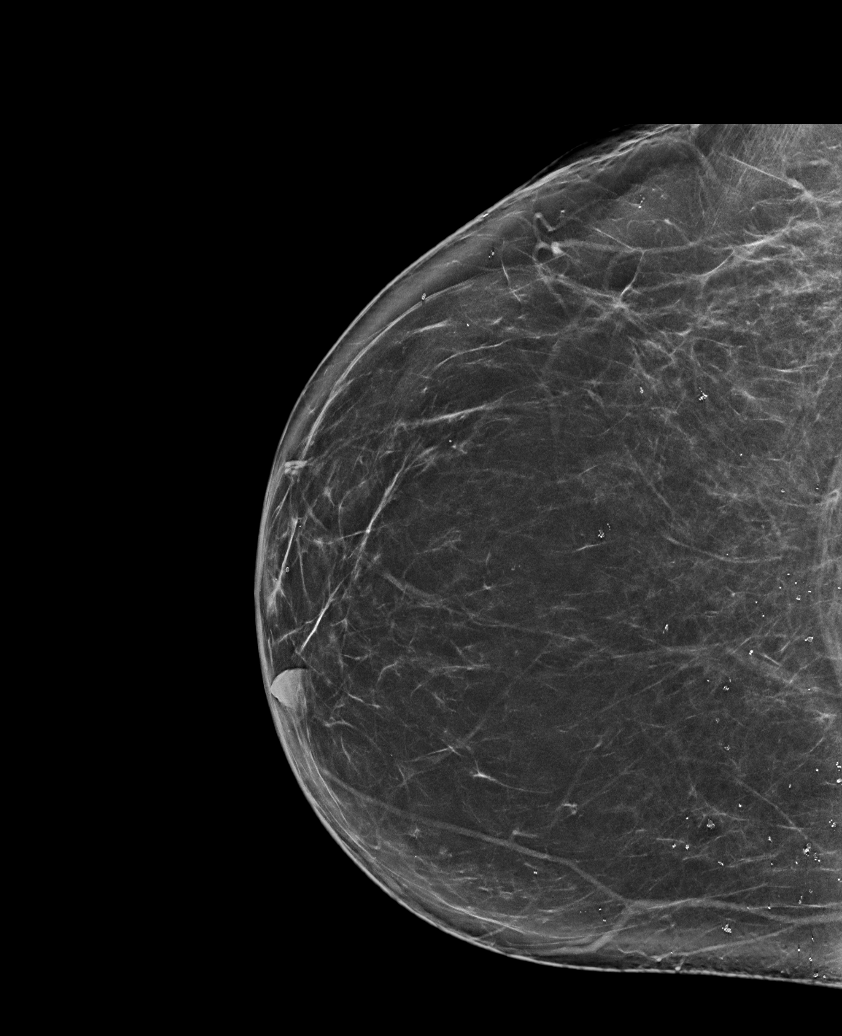

[L MLO synth-2D]
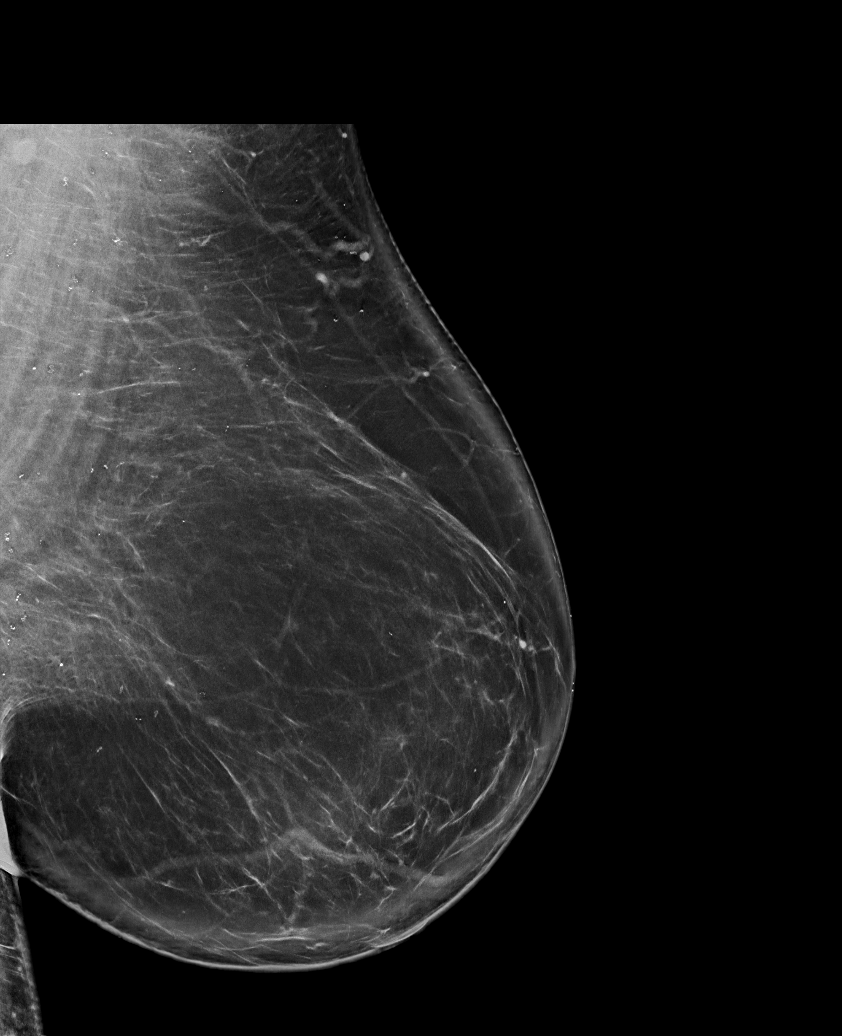

[R MLO synth-2D (2 of 2)]
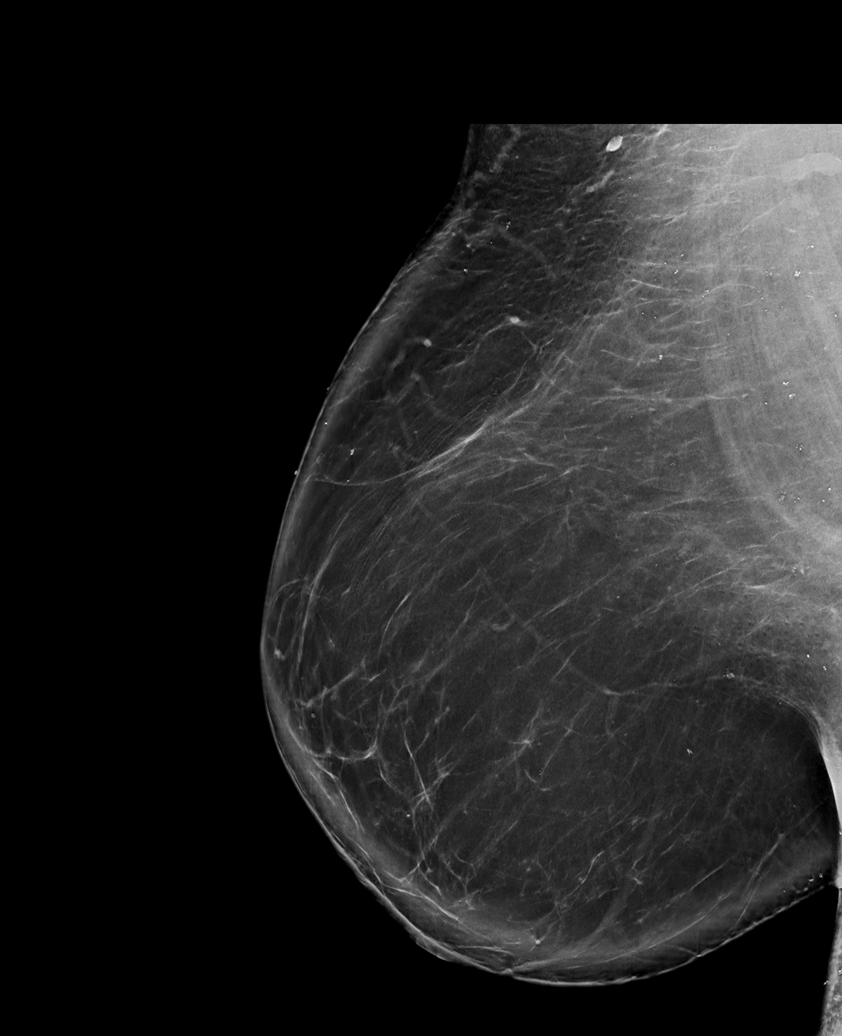

[R CC tomo · tomo slice 43/85.0]
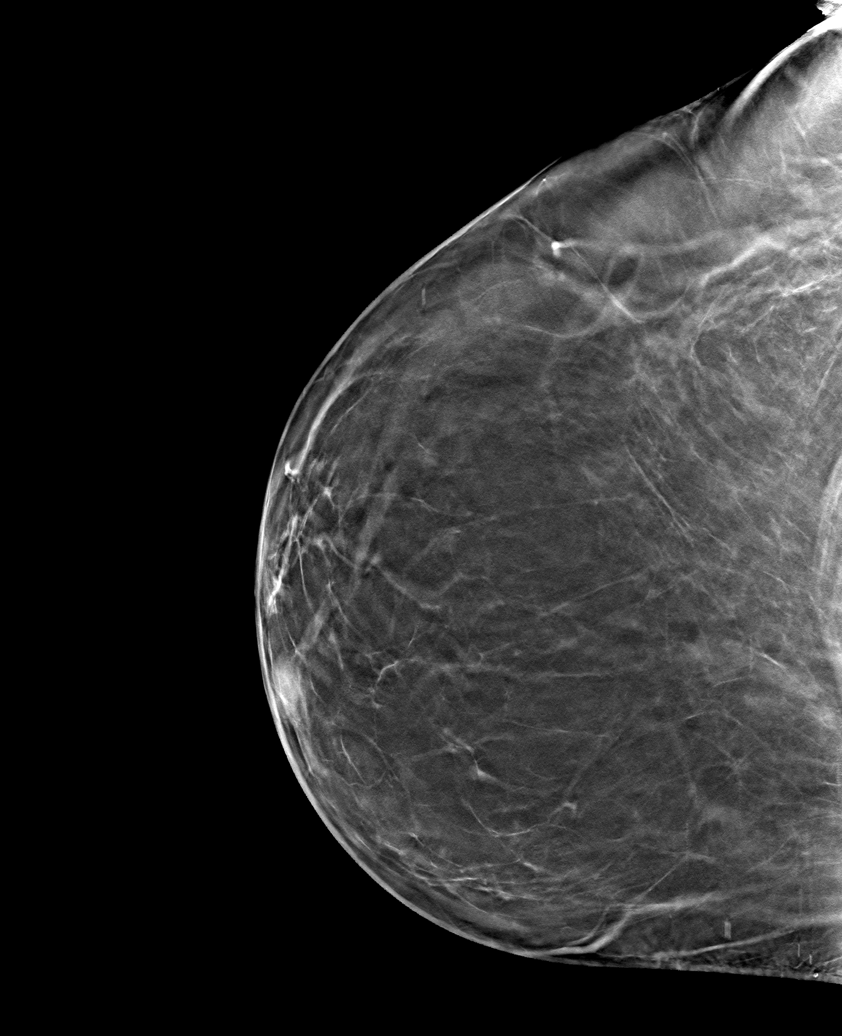

[6 of 30 positions shown; findings below may reference images not displayed]

ACR Breast Density Category b: There are scattered areas of
fibroglandular density.
FINDINGS: There are no findings suspicious for malignancy.
IMPRESSION: No mammographic evidence of malignancy. A result letter of this
screening mammogram will be mailed directly to the patient.

RECOMMENDATION:
Screening mammogram in one year. (Code:51-O-LD2)

BI-RADS CATEGORY  1: Negative.

## 2023-10-18 DIAGNOSIS — R7989 Other specified abnormal findings of blood chemistry: Secondary | ICD-10-CM | POA: Diagnosis not present

## 2023-10-18 DIAGNOSIS — M7660 Achilles tendinitis, unspecified leg: Secondary | ICD-10-CM | POA: Diagnosis not present

## 2023-10-18 DIAGNOSIS — Z79899 Other long term (current) drug therapy: Secondary | ICD-10-CM | POA: Diagnosis not present

## 2023-10-18 DIAGNOSIS — R5383 Other fatigue: Secondary | ICD-10-CM | POA: Diagnosis not present

## 2023-10-18 DIAGNOSIS — M0579 Rheumatoid arthritis with rheumatoid factor of multiple sites without organ or systems involvement: Secondary | ICD-10-CM | POA: Diagnosis not present

## 2023-10-18 DIAGNOSIS — Z6841 Body Mass Index (BMI) 40.0 and over, adult: Secondary | ICD-10-CM | POA: Diagnosis not present

## 2023-11-16 DIAGNOSIS — E7800 Pure hypercholesterolemia, unspecified: Secondary | ICD-10-CM | POA: Diagnosis not present

## 2023-11-16 DIAGNOSIS — E1165 Type 2 diabetes mellitus with hyperglycemia: Secondary | ICD-10-CM | POA: Diagnosis not present

## 2023-11-16 DIAGNOSIS — I10 Essential (primary) hypertension: Secondary | ICD-10-CM | POA: Diagnosis not present

## 2023-11-16 DIAGNOSIS — Z1211 Encounter for screening for malignant neoplasm of colon: Secondary | ICD-10-CM | POA: Diagnosis not present

## 2023-11-16 DIAGNOSIS — Z23 Encounter for immunization: Secondary | ICD-10-CM | POA: Diagnosis not present

## 2023-11-23 ENCOUNTER — Other Ambulatory Visit: Payer: Self-pay | Admitting: Student

## 2023-11-23 DIAGNOSIS — Z1231 Encounter for screening mammogram for malignant neoplasm of breast: Secondary | ICD-10-CM

## 2023-12-17 ENCOUNTER — Ambulatory Visit
Admission: RE | Admit: 2023-12-17 | Discharge: 2023-12-17 | Disposition: A | Source: Ambulatory Visit | Attending: Student

## 2023-12-17 DIAGNOSIS — Z1231 Encounter for screening mammogram for malignant neoplasm of breast: Secondary | ICD-10-CM

## 2024-01-08 DIAGNOSIS — E1165 Type 2 diabetes mellitus with hyperglycemia: Secondary | ICD-10-CM | POA: Diagnosis not present

## 2024-01-08 DIAGNOSIS — I10 Essential (primary) hypertension: Secondary | ICD-10-CM | POA: Diagnosis not present

## 2024-01-08 DIAGNOSIS — E7800 Pure hypercholesterolemia, unspecified: Secondary | ICD-10-CM | POA: Diagnosis not present

## 2024-01-17 DIAGNOSIS — M0579 Rheumatoid arthritis with rheumatoid factor of multiple sites without organ or systems involvement: Secondary | ICD-10-CM | POA: Diagnosis not present

## 2024-02-28 IMAGING — DX DG HAND COMPLETE 3+V*R*
3 series · 3 of 3 positions shown · non-contrast
Comparison: None.

CLINICAL DATA: Right hand injury, 3rd and 4th fingers at DIP joint
closed in car door this morning.

EXAM:
RIGHT HAND - COMPLETE 3+ VIEW

[dg hand complete right (1 of 3)]
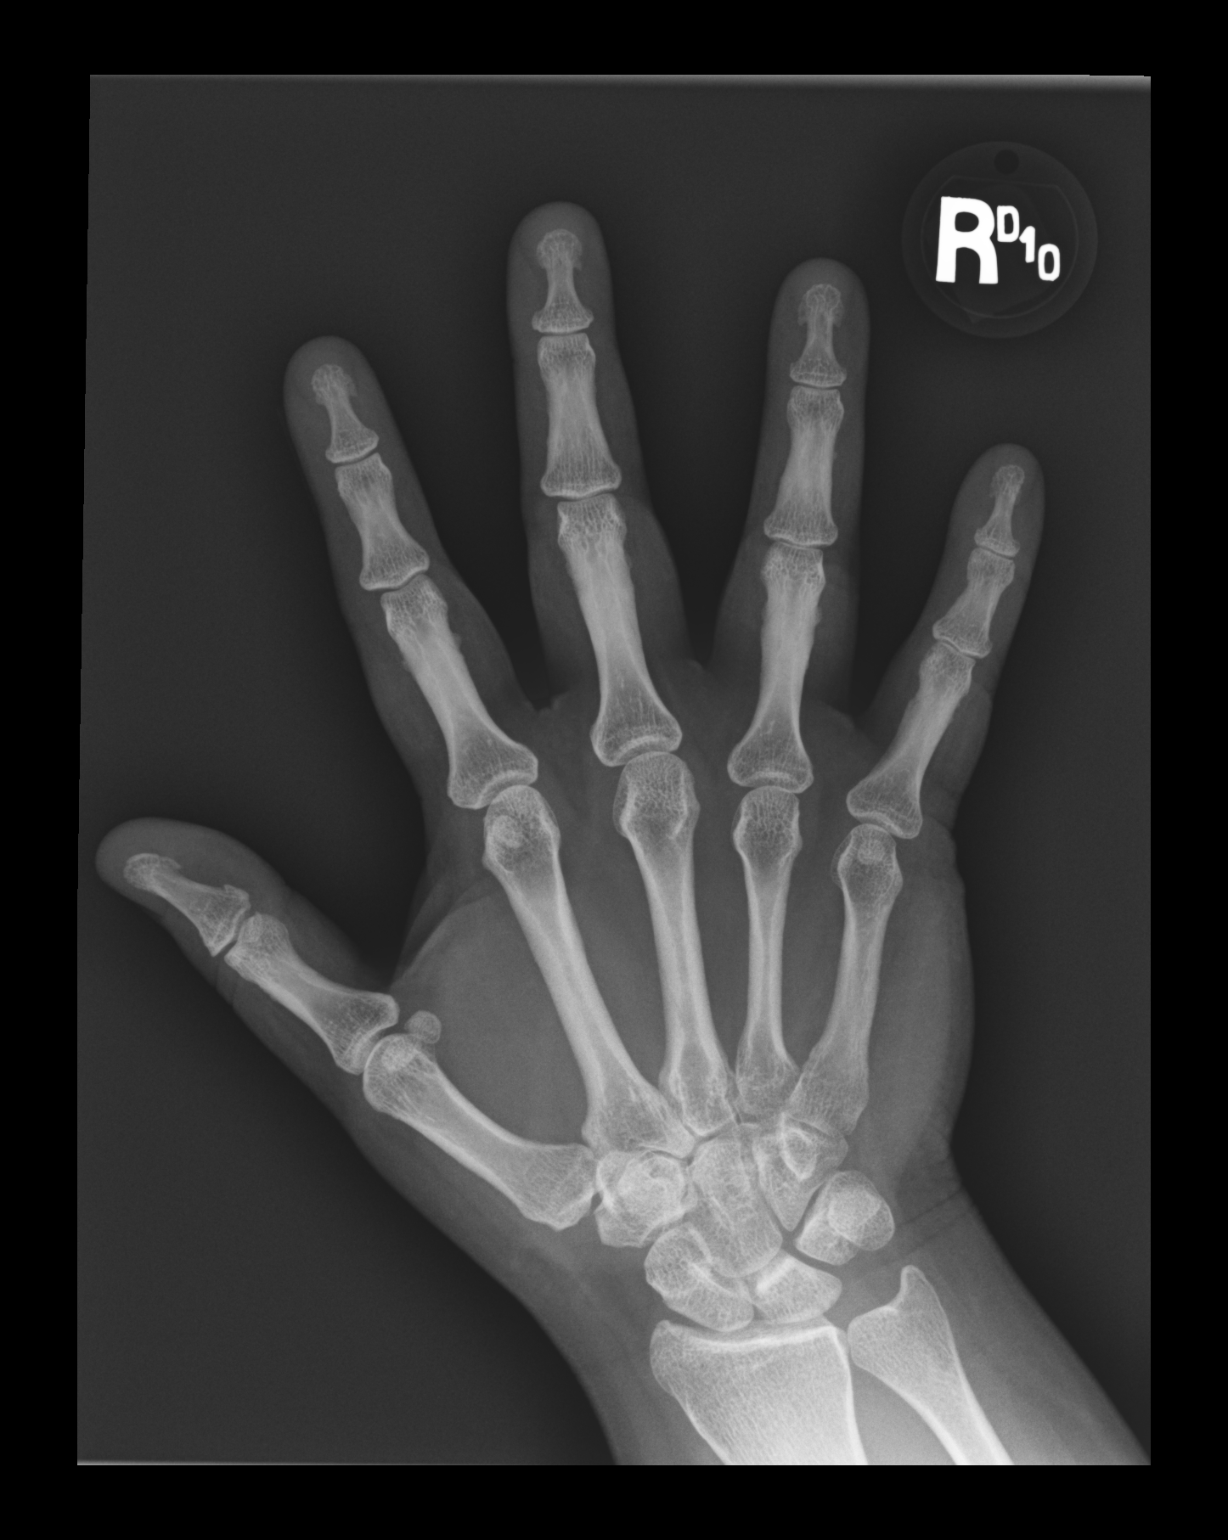

[dg hand complete right (2 of 3)]
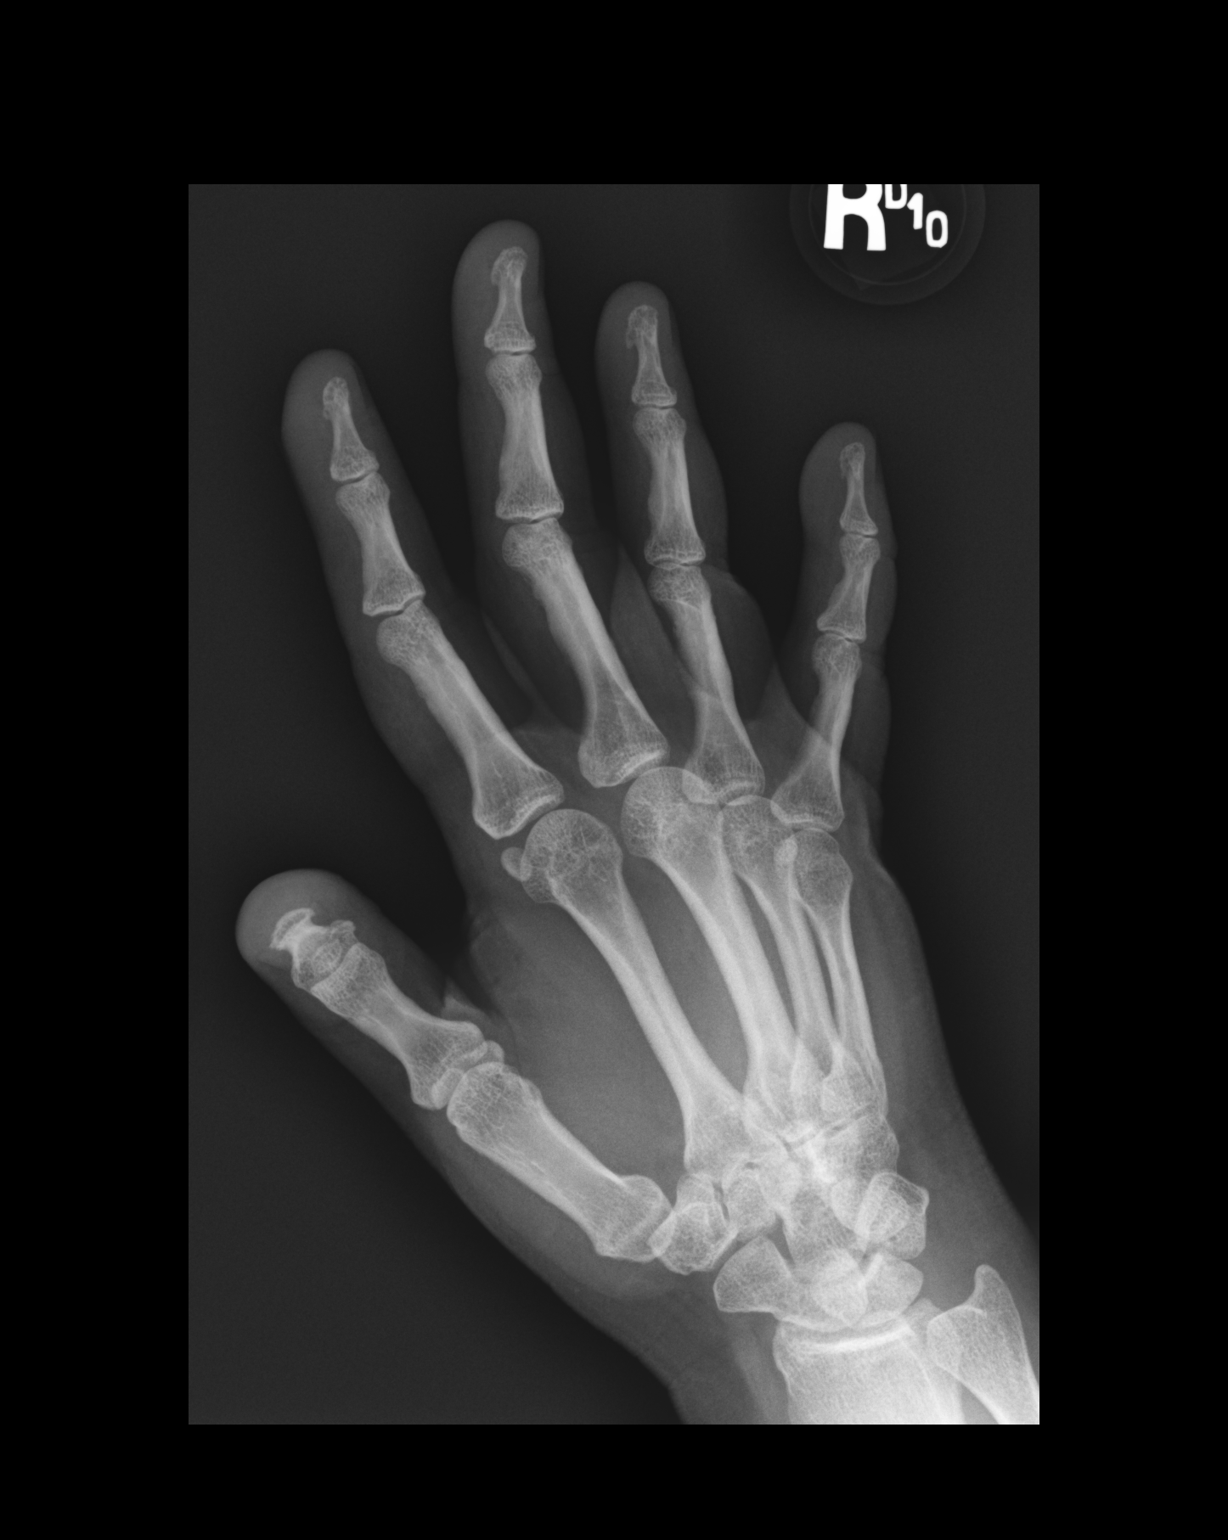

[dg hand complete right (3 of 3)]
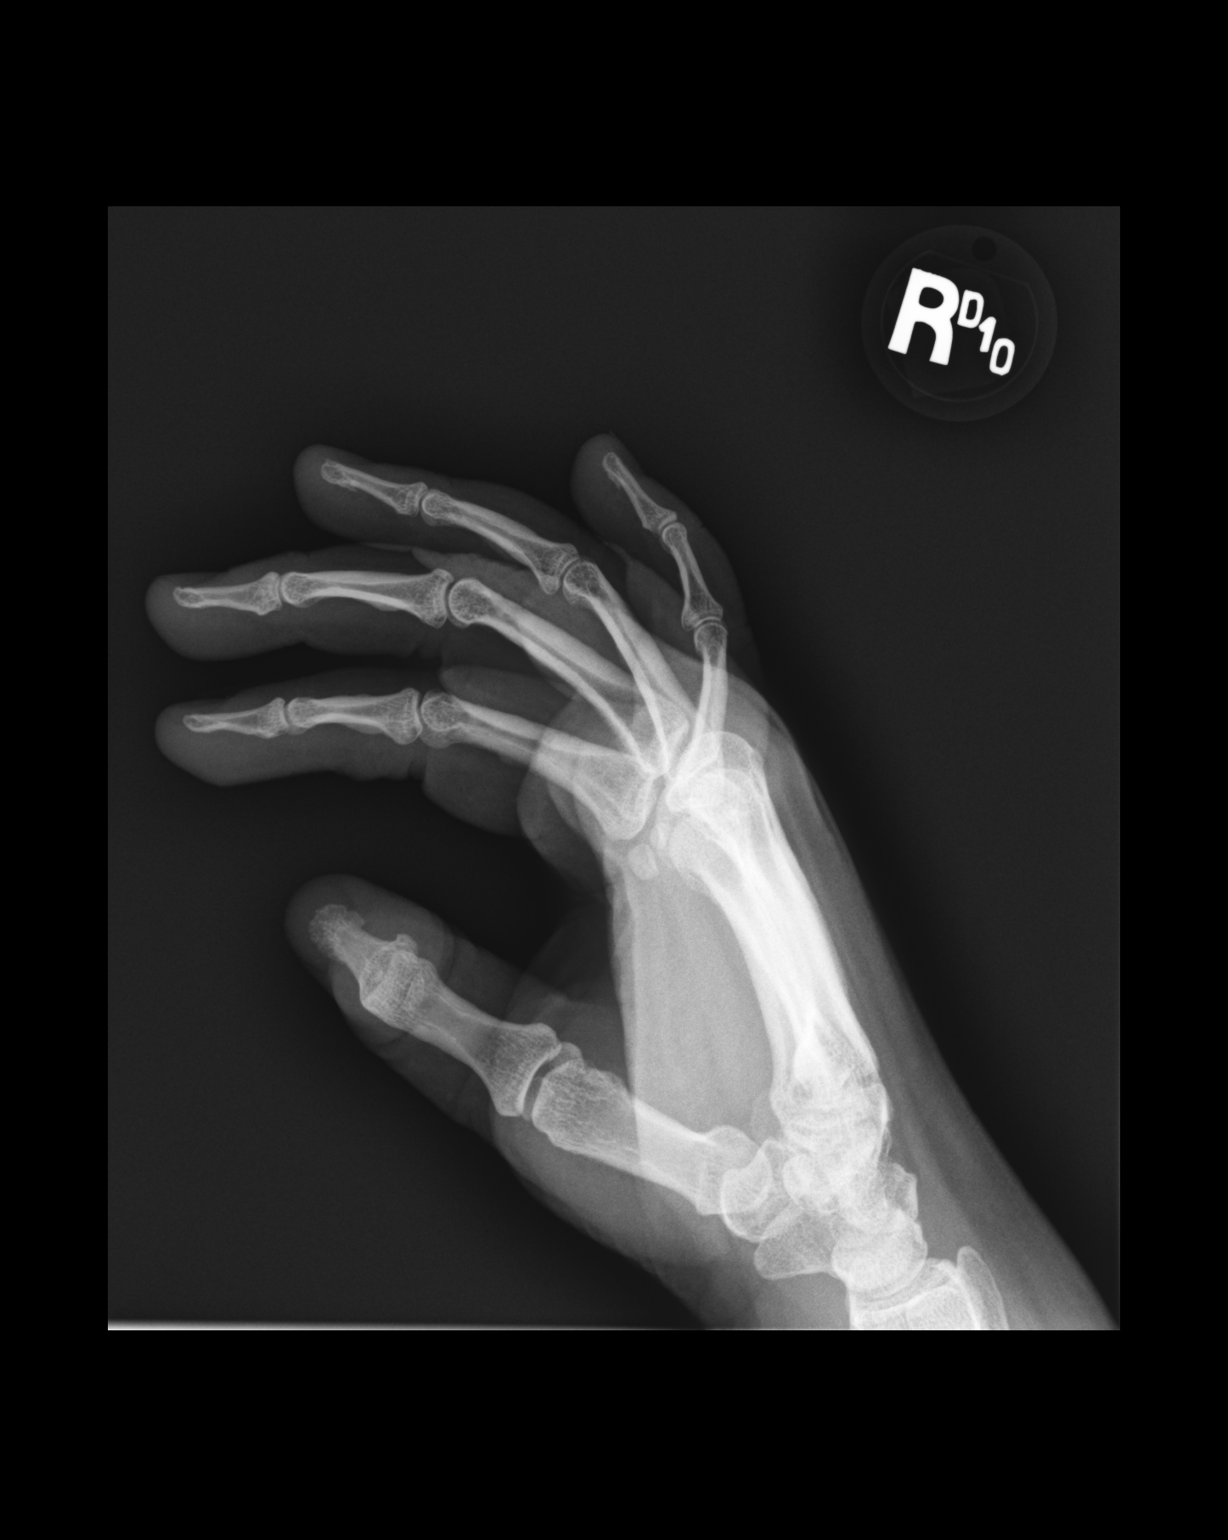

[3 of 3 positions shown; findings below may reference images not displayed]

FINDINGS: There is no evidence of fracture or dislocation. There is no
evidence of arthropathy or other focal bone abnormality. Soft
tissues are unremarkable.
IMPRESSION: Negative.
